# Patient Record
Sex: Male | Born: 1970 | Race: Black or African American | Hispanic: No | Marital: Married | Smoking: Never smoker
Health system: Southern US, Community
[De-identification: ages and names within clinical notes are randomized; demographics above are authoritative.]

## PROBLEM LIST (undated history)

## (undated) DIAGNOSIS — I10 Essential (primary) hypertension: Secondary | ICD-10-CM

## (undated) DIAGNOSIS — R7303 Prediabetes: Secondary | ICD-10-CM

## (undated) DIAGNOSIS — K760 Fatty (change of) liver, not elsewhere classified: Secondary | ICD-10-CM

## (undated) DIAGNOSIS — T7840XA Allergy, unspecified, initial encounter: Secondary | ICD-10-CM

## (undated) DIAGNOSIS — E039 Hypothyroidism, unspecified: Secondary | ICD-10-CM

## (undated) DIAGNOSIS — M1990 Unspecified osteoarthritis, unspecified site: Secondary | ICD-10-CM

## (undated) DIAGNOSIS — I517 Cardiomegaly: Secondary | ICD-10-CM

## (undated) HISTORY — DX: Unspecified osteoarthritis, unspecified site: M19.90

## (undated) HISTORY — DX: Cardiomegaly: I51.7

## (undated) HISTORY — DX: Hypothyroidism, unspecified: E03.9

## (undated) HISTORY — DX: Prediabetes: R73.03

## (undated) HISTORY — DX: Essential (primary) hypertension: I10

## (undated) HISTORY — DX: Fatty (change of) liver, not elsewhere classified: K76.0

## (undated) HISTORY — DX: Allergy, unspecified, initial encounter: T78.40XA

---

## 2015-01-22 ENCOUNTER — Other Ambulatory Visit: Payer: Self-pay | Admitting: Physician Assistant

## 2015-01-22 ENCOUNTER — Ambulatory Visit (INDEPENDENT_AMBULATORY_CARE_PROVIDER_SITE_OTHER): Payer: Self-pay | Admitting: Physician Assistant

## 2015-01-22 VITALS — BP 150/80 | HR 101 | Temp 99.0°F | Resp 20 | Ht 73.0 in | Wt 298.6 lb

## 2015-01-22 DIAGNOSIS — IMO0001 Reserved for inherently not codable concepts without codable children: Secondary | ICD-10-CM

## 2015-01-22 DIAGNOSIS — R030 Elevated blood-pressure reading, without diagnosis of hypertension: Secondary | ICD-10-CM

## 2015-01-22 LAB — POCT URINALYSIS DIP (MANUAL ENTRY)
BILIRUBIN UA: NEGATIVE
Bilirubin, UA: NEGATIVE
GLUCOSE UA: NEGATIVE
Leukocytes, UA: NEGATIVE
Nitrite, UA: NEGATIVE
Protein Ur, POC: NEGATIVE
SPEC GRAV UA: 1.02
Urobilinogen, UA: 0.2
pH, UA: 5.5

## 2015-01-22 MED ORDER — AMLODIPINE BESYLATE 2.5 MG PO TABS: 2.5000 mg | ORAL_TABLET | Freq: Every day | ORAL | Status: DC

## 2015-01-22 NOTE — Patient Instructions (Signed)
Blood Pressure Record Sheet Your blood pressure on this visit to the emergency department or clinic is elevated. This does not necessarily mean you have high blood pressure (hypertension), but it does mean that your blood pressure needs to be rechecked. Many times your blood pressure can increase due to illness, pain, anxiety, or other factors. We recommend that you get a series of blood pressure readings done over a period of 5 days. It is best to get a reading in the morning and one in the evening. You should make sure to sit and relax for 1-5 minutes before the reading is taken. Write the readings down and make a follow-up appointment with your health care provider to discuss the results. If there is not a free clinic or a drug store with a blood-pressure-taking machine near you, you can purchase blood-pressure-taking equipment from a drug store. Having one in the home allows you the convenience of taking your blood pressure while you are home and relaxed.  Your blood pressure in the emergency department or clinic on ________ was ____________________. BLOOD PRESSURE LOG  Date Time Blood pressure                                                                                 This information is not intended to replace advice given to you by your health care provider. Make sure you discuss any questions you have with your health care provider.   Document Released: 12/07/2002 Document Revised: 03/31/2014 Document Reviewed: 05/03/2013 Elsevier Interactive Patient Education [employer]2016 Elsevier Inc.

## 2015-01-22 NOTE — Progress Notes (Signed)
01/22/2015 at 7:41 PM  Shawn Becker / DOB: 24-Mar-1971 / MRN: [id number]  The patient  does not have a problem list on file.  SUBJECTIVE  Shawn Becker is a 44 y.o. male who complains of elevated BP. Reports he was at the dentist office today and his pressure was measured twice at 190/120.  He was advised to seek medical care.  Denies chest pain, SOB, leg and belly swelling, orthopnea, dizziness and HA.  Reports he has been taking work out supplements that include large amounts of caffeine and other poorly studied stimulants.  Denies steroid use.     He is concerned about his weight and would like to know the optimal weight for his height.    He  has a past medical history of Allergy.    Medications reviewed and updated by myself where necessary, and exist elsewhere in the encounter.   Shawn Becker is allergic to penicillins. He  reports that he has never smoked. He does not have any smokeless tobacco history on file. He reports that he does not drink alcohol or use illicit drugs. He  has no sexual activity history on file. The patient  has no past surgical history on file.  His family history includes Cancer in his maternal grandfather; Diabetes in his maternal grandmother and mother; Hyperlipidemia in his paternal grandmother; Hypertension in his maternal grandmother and mother; Mental retardation in his mother; Stroke in his mother.  Review of Systems  Respiratory: Negative for cough and shortness of breath.   Cardiovascular: Negative for chest pain.  Neurological: Negative for dizziness.    OBJECTIVE  His  height is 6\' 1"  (1.854 m) and weight is 298 lb 9.6 oz (135.444 kg). His oral temperature is 99 F (37.2 C). His blood pressure is 150/80 and his pulse is 101. His respiration is 20 and oxygen saturation is 98%.  The patient's body mass index is 39.4 kg/(m^2).  Physical Exam  Vitals reviewed. Constitutional: He is oriented to person, place, and time. He appears  well-developed. No distress.  Eyes: EOM are normal. Pupils are equal, round, and reactive to light. No scleral icterus.  Neck: Normal range of motion.  Cardiovascular: Normal rate, regular rhythm, normal heart sounds and intact distal pulses.  Exam reveals no gallop and no friction rub.   No murmur heard. Pulses:      Radial pulses are 2+ on the right side, and 2+ on the left side.       Dorsalis pedis pulses are 2+ on the right side, and 2+ on the left side.       Posterior tibial pulses are 2+ on the right side, and 2+ on the left side.  Pulse 90.   Respiratory: Effort normal and breath sounds normal.  GI: He exhibits no distension.  Musculoskeletal: Normal range of motion.  Neurological: He is alert and oriented to person, place, and time. No cranial nerve deficit.  Skin: Skin is warm and dry. No rash noted. He is not diaphoretic.  Psychiatric: He has a normal mood and affect.    Results for orders placed or performed in visit on 01/22/15 (from the past 24 hour(s))  POCT urinalysis dipstick     Status: Abnormal   Collection Time: 01/22/15  5:52 PM  Result Value Ref Range   Color, UA yellow yellow   Clarity, UA clear clear   Glucose, UA negative negative   Bilirubin, UA negative negative   Ketones, POC UA negative negative   Spec  Grav, UA 1.020    Blood, UA trace-intact (A) negative   pH, UA 5.5    Protein Ur, POC negative negative   Urobilinogen, UA 0.2    Nitrite, UA Negative Negative   Leukocytes, UA Negative Negative    ASSESSMENT & PLAN  Shawn Becker was seen today for hypertension.  Diagnoses and all orders for this visit:  Elevated BP: Patient with BP ranging in the stage one hypertensive range.  He is also obese.  I have advised that he purchase a BP cuff (large) and check and record daily.  Log provided via AVS and he is to return this to me in 2 to 3 weeks or review. Will start low dose amlodipine for now and will increase per ambulatory results.  -     CBC -      TSH -     COMPLETE METABOLIC PANEL WITH GFR -     POCT urinalysis dipstick -     amLODipine (NORVASC) 2.5 MG tablet; Take 1 tablet (2.5 mg total) by mouth daily.    The patient was advised to call or come back to clinic if he does not see an improvement in symptoms, or worsens with the above plan.   Deliah Boston, MHS, PA-C [facility] 01/22/2015 7:41 PM

## 2015-01-23 LAB — COMPLETE METABOLIC PANEL WITH GFR
ALBUMIN: 4.1 g/dL (ref 3.6–5.1)
ALT: 32 U/L (ref 9–46)
AST: 30 U/L (ref 10–40)
Alkaline Phosphatase: 70 U/L (ref 40–115)
BUN: 10 mg/dL (ref 7–25)
CO2: 26 mmol/L (ref 20–31)
Calcium: 9.5 mg/dL (ref 8.6–10.3)
Chloride: 101 mmol/L (ref 98–110)
Creat: 1.18 mg/dL (ref 0.60–1.35)
GFR, Est African American: 88 mL/min (ref >=60)
GFR, Est Non African American: 76 mL/min (ref >=60)
GLUCOSE: 87 mg/dL (ref 65–99)
POTASSIUM: 4.2 mmol/L (ref 3.5–5.3)
SODIUM: 138 mmol/L (ref 135–146)
Total Bilirubin: 0.6 mg/dL (ref 0.2–1.2)
Total Protein: 7.5 g/dL (ref 6.1–8.1)

## 2015-01-23 LAB — CBC
HEMATOCRIT: 42.7 % (ref 39.0–52.0)
HEMOGLOBIN: 14.1 g/dL (ref 13.0–17.0)
MCH: 25.2 pg — ABNORMAL LOW (ref 26.0–34.0)
MCHC: 33 g/dL (ref 30.0–36.0)
MCV: 76.3 fL — ABNORMAL LOW (ref 78.0–100.0)
MPV: 10.8 fL (ref 8.6–12.4)
Platelets: 267 10*3/uL (ref 150–400)
RBC: 5.6 MIL/uL (ref 4.22–5.81)
RDW: 15.7 % — AB (ref 11.5–15.5)
WBC: 4.2 10*3/uL (ref 4.0–10.5)

## 2015-01-23 LAB — TSH: TSH: 6.728 u[IU]/mL — ABNORMAL HIGH (ref 0.350–4.500)

## 2015-01-24 LAB — T4, FREE: Free T4: 0.84 ng/dL (ref 0.80–1.80)

## 2015-01-24 LAB — T3, FREE: T3 FREE: 3.2 pg/mL (ref 2.3–4.2)

## 2015-01-25 LAB — THYROID PEROXIDASE ANTIBODY: Thyroperoxidase Ab SerPl-aCnc: 763 IU/mL — ABNORMAL HIGH (ref <9)

## 2015-01-26 NOTE — Addendum Note (Signed)
Addended by: Carmelina DaneANDERSON, Talton Delpriore S on: 01/26/2015 08:49 AM   Modules accepted: Kipp BroodSmartSet

## 2015-01-26 NOTE — Progress Notes (Signed)
  Medical screening examination/treatment/procedure(s) were performed by non-physician practitioner and as supervising physician I was immediately available for consultation/collaboration.     

## 2015-01-29 ENCOUNTER — Telehealth: Payer: Self-pay

## 2015-01-29 NOTE — Telephone Encounter (Signed)
Pt is trying to get dental work done but blood pressure is still elevated and is wanting to discuss what could be changed   [phone]

## 2015-01-30 ENCOUNTER — Other Ambulatory Visit: Payer: Self-pay | Admitting: Physician Assistant

## 2015-01-30 DIAGNOSIS — E039 Hypothyroidism, unspecified: Secondary | ICD-10-CM

## 2015-01-30 NOTE — Telephone Encounter (Signed)
He needs to come in?  The patient was advised to call or come back to clinic if he does not see an improvement in symptoms, or worsens with the above plan.   Deliah BostonMichael Clark, MHS, PA-C [facility] 01/22/2015 7:41 PM

## 2015-01-31 NOTE — Telephone Encounter (Signed)
Last pressure in our office was largely normal.  I asked him to complete a BP log as this is the most accurate way to assess his pressure.  Please inquire if this has been done.  He was started on Norvasc by me and this can be increased, however I would be uncomfortable with doing this with no objective information. Deliah BostonMichael Lee Kalt, MS, PA-C   12:30 AM, 01/31/2015

## 2015-02-01 NOTE — Telephone Encounter (Signed)
Left message for pt to call back  °

## 2015-02-01 NOTE — Telephone Encounter (Signed)
Please advise that he increase his medication to 5 mg daily.  He is to continue monitoring and advise that he call back with new measures in 1 week. Will consider a second therapy pending those results.  Deliah BostonMichael Clark, MS, PA-C   2:56 PM, 02/01/2015

## 2015-02-01 NOTE — Telephone Encounter (Signed)
Spoke with pt, his log is below.   11/2 174/115 11/3 169/108 11/4 161/97  11/5 171/109 11/7 169/107 11/8 154/100

## 2015-02-05 NOTE — Telephone Encounter (Signed)
Spoke with pt, advised message from Deliah BostonMichael Clark. Pt understood.

## 2015-02-14 ENCOUNTER — Other Ambulatory Visit: Payer: Self-pay | Admitting: Physician Assistant

## 2015-02-14 DIAGNOSIS — I10 Essential (primary) hypertension: Secondary | ICD-10-CM

## 2015-02-14 MED ORDER — HYDROCHLOROTHIAZIDE 25 MG PO TABS: 12.5000 mg | ORAL_TABLET | Freq: Every day | ORAL | Status: DC

## 2015-02-20 ENCOUNTER — Other Ambulatory Visit (INDEPENDENT_AMBULATORY_CARE_PROVIDER_SITE_OTHER): Payer: Self-pay | Admitting: Family Medicine

## 2015-02-20 DIAGNOSIS — E039 Hypothyroidism, unspecified: Secondary | ICD-10-CM

## 2015-02-20 NOTE — Progress Notes (Signed)
Patient here today for Lab draw only 

## 2015-02-21 LAB — TSH: TSH: 5.506 u[IU]/mL — AB (ref 0.350–4.500)

## 2015-04-11 ENCOUNTER — Ambulatory Visit (INDEPENDENT_AMBULATORY_CARE_PROVIDER_SITE_OTHER): Payer: Self-pay | Admitting: Physician Assistant

## 2015-04-11 ENCOUNTER — Ambulatory Visit: Payer: Self-pay | Admitting: Physician Assistant

## 2015-04-11 ENCOUNTER — Encounter: Payer: Self-pay | Admitting: Physician Assistant

## 2015-04-11 ENCOUNTER — Telehealth: Payer: Self-pay

## 2015-04-11 VITALS — BP 140/80 | HR 108 | Temp 97.9°F | Resp 18 | Ht 72.5 in | Wt 294.0 lb

## 2015-04-11 DIAGNOSIS — R030 Elevated blood-pressure reading, without diagnosis of hypertension: Secondary | ICD-10-CM

## 2015-04-11 DIAGNOSIS — IMO0001 Reserved for inherently not codable concepts without codable children: Secondary | ICD-10-CM

## 2015-04-11 DIAGNOSIS — I10 Essential (primary) hypertension: Secondary | ICD-10-CM

## 2015-04-11 DIAGNOSIS — E063 Autoimmune thyroiditis: Secondary | ICD-10-CM

## 2015-04-11 LAB — POCT GLYCOSYLATED HEMOGLOBIN (HGB A1C): Hemoglobin A1C: 6

## 2015-04-11 MED ORDER — AMLODIPINE BESYLATE 10 MG PO TABS: 10.0000 mg | ORAL_TABLET | Freq: Every day | ORAL | Status: DC

## 2015-04-11 MED ORDER — HYDROCHLOROTHIAZIDE 25 MG PO TABS: 25.0000 mg | ORAL_TABLET | Freq: Every day | ORAL | Status: DC

## 2015-04-11 NOTE — Telephone Encounter (Signed)
Pt came by today to pay bill. Front desk realized pt was never called regarding elevated TSH. I spoke to pt and scheduled him to see you tomorrow at 3 to recheck thyroid and hypertension.

## 2015-04-11 NOTE — Telephone Encounter (Signed)
Pt. Called back wanting to know if was able to still come in and see michael clark for his 3:00 appt., I spoke with Casimiro Needle once we figured out what he needed for his recheck and Casimiro Needle asked could he just be rescheduled and I informed the Pt. That we would reschedule him and sent him to who schedules appt.

## 2015-04-11 NOTE — Telephone Encounter (Signed)
Ignore the scheduled appt for tomorrow. It was actually for 3pm today and pt cannot come today. Anyway we can fit him in during your appts next week? Your only opening was for a hospital follow up and they would not fill that with his recheck. I would really like to try and get pt an appt because it was our mistake on not calling him and getting him an appt sooner. I do not want to make him wait in the walk in clinic to see you.   Please let me know and I will call him.

## 2015-04-11 NOTE — Progress Notes (Signed)
04/11/2015 5:40 PM   DOB: 02/22/1971 / MRN: [id number]  SUBJECTIVE:  Shawn Becker is a 45 y.o. male presenting for HTN recheck.  I saw him roughly 3 months ago and he was complaining that he BP was elevated, however [facility] showed his BP was marginally elevated.  He was subsequently started on low dose amlodipine  Advised that he check this ambulatory and his BP was in fact running high (see phone messaging from 02/01/15).  Today his BP remains elevated despite Norvasc 5 mg.  He has not been taking HCTZ, reports that he has never   His Thyroid studies reveal Hashimotos.  However his TSH was marginally elevated at last check 1 month ago.    He is allergic to penicillins.   He  has a past medical history of Allergy.    He  reports that he has never smoked. He does not have any smokeless tobacco history on file. He reports that he does not drink alcohol or use illicit drugs. He  has no sexual activity history on file. The patient  has no past surgical history on file.  His family history includes Cancer in his maternal grandfather and paternal grandfather; Diabetes in his maternal grandmother and mother; Hyperlipidemia in his paternal grandmother; Hypertension in his maternal grandmother and mother; Mental illness in his mother; Stroke in his mother.  Review of Systems  Constitutional: Negative for fever and chills.  Eyes: Negative for blurred vision.  Respiratory: Negative for cough and shortness of breath.   Cardiovascular: Negative for chest pain.  Gastrointestinal: Negative for nausea and abdominal pain.  Genitourinary: Negative for dysuria, urgency and frequency.  Musculoskeletal: Negative for myalgias.  Skin: Negative for rash.  Neurological: Negative for dizziness, tingling and headaches.  Psychiatric/Behavioral: Negative for depression. The patient is not nervous/anxious.     Problem list and medications reviewed and updated by myself where necessary, and exist elsewhere in the  encounter.   OBJECTIVE:  BP 140/80 mmHg  Pulse 108  Temp(Src) 97.9 F (36.6 C)  Resp 18  Ht 6' 0.5" (1.842 m)  Wt 294 lb (133.358 kg)  BMI 39.30 kg/m2  Physical Exam  Constitutional: He is oriented to person, place, and time. He appears well-developed. He does not appear ill.  Eyes: Conjunctivae and EOM are normal. Pupils are equal, round, and reactive to light.  Neck: No thyromegaly present.  Cardiovascular: Regular rhythm and intact distal pulses.  Exam reveals no gallop and no friction rub.   No murmur heard. Pulmonary/Chest: Effort normal and breath sounds normal.  Abdominal: He exhibits no distension.  Musculoskeletal: Normal range of motion.  Neurological: He is alert and oriented to person, place, and time. No cranial nerve deficit. Coordination normal.  Skin: Skin is warm and dry. He is not diaphoretic.  Psychiatric: He has a normal mood and affect.  Nursing note and vitals reviewed.   Results for orders placed or performed in visit on 04/11/15 (from the past 48 hour(s))  POCT glycosylated hemoglobin (Hb A1C)     Status: None   Collection Time: 04/11/15  3:48 PM  Result Value Ref Range   Hemoglobin A1C 6.0     ASSESSMENT AND PLAN  Shawn Becker was seen today for follow-up, hypertension and thyroid problem.  Diagnoses and all orders for this visit:  Elevated BP: His BP at check in was 170/100.  This came down to 140/80 when I rechecked this. Will increase his medication to 10 mg Norvasc daily and advise that he monitor  his bp on an ambulatory basis for the next week and to call with these numbers.   -     amLODipine (NORVASC) 10 MG tablet; Take 1 tablet (10 mg total) by mouth daily.  Essential hypertension -     Discontinue: hydrochlorothiazide (HYDRODIURIL) 25 MG tablet; Take 1 tablet (25 mg total) by mouth daily. -     TSH -     POCT glycosylated hemoglobin (Hb A1C)  Hashimoto's thyroiditis: Will evaluate TSH further.  He will likely need some replacement.      -     TSH    The patient was advised to call or return to clinic if he does not see an improvement in symptoms or to seek the care of the closest emergency department if he worsens with the above plan.   Deliah Boston, MHS, PA-C [facility] 04/11/2015 5:40 PM

## 2015-04-12 LAB — TSH: TSH: 5.391 u[IU]/mL — ABNORMAL HIGH (ref 0.350–4.500)

## 2015-04-13 ENCOUNTER — Other Ambulatory Visit: Payer: Self-pay | Admitting: Physician Assistant

## 2015-04-13 DIAGNOSIS — E063 Autoimmune thyroiditis: Secondary | ICD-10-CM

## 2015-04-13 MED ORDER — LEVOTHYROXINE SODIUM 25 MCG PO TABS: 12.5000 ug | ORAL_TABLET | Freq: Every day | ORAL | Status: DC

## 2015-09-12 ENCOUNTER — Other Ambulatory Visit: Payer: Self-pay | Admitting: Physician Assistant

## 2016-02-21 ENCOUNTER — Other Ambulatory Visit: Payer: Self-pay | Admitting: Physician Assistant

## 2016-02-24 NOTE — Telephone Encounter (Signed)
03/2015 last ov 12/2014 last labs Needs ov

## 2016-02-29 ENCOUNTER — Other Ambulatory Visit: Payer: Self-pay | Admitting: Physician Assistant

## 2016-03-24 HISTORY — PX: WISDOM TOOTH EXTRACTION: SHX21

## 2016-04-15 ENCOUNTER — Ambulatory Visit (INDEPENDENT_AMBULATORY_CARE_PROVIDER_SITE_OTHER): Payer: Self-pay

## 2016-04-15 ENCOUNTER — Ambulatory Visit (INDEPENDENT_AMBULATORY_CARE_PROVIDER_SITE_OTHER): Payer: Self-pay | Admitting: Physician Assistant

## 2016-04-15 VITALS — BP 160/122 | HR 112 | Temp 99.6°F | Resp 14 | Ht 72.0 in | Wt 253.0 lb

## 2016-04-15 DIAGNOSIS — I517 Cardiomegaly: Secondary | ICD-10-CM

## 2016-04-15 DIAGNOSIS — R000 Tachycardia, unspecified: Secondary | ICD-10-CM

## 2016-04-15 DIAGNOSIS — I10 Essential (primary) hypertension: Secondary | ICD-10-CM | POA: Insufficient documentation

## 2016-04-15 DIAGNOSIS — Z6834 Body mass index (BMI) 34.0-34.9, adult: Secondary | ICD-10-CM

## 2016-04-15 DIAGNOSIS — E063 Autoimmune thyroiditis: Secondary | ICD-10-CM

## 2016-04-15 LAB — POCT GLYCOSYLATED HEMOGLOBIN (HGB A1C): Hemoglobin A1C: 5.8

## 2016-04-15 MED ORDER — LEVOTHYROXINE SODIUM 25 MCG PO TABS: 12.5000 ug | ORAL_TABLET | Freq: Every day | ORAL | 3 refills | Status: DC

## 2016-04-15 MED ORDER — METOPROLOL SUCCINATE ER 25 MG PO TB24: 25.0000 mg | ORAL_TABLET | Freq: Every day | ORAL | 3 refills | Status: DC

## 2016-04-15 MED ORDER — AMLODIPINE BESYLATE 10 MG PO TABS: 10.0000 mg | ORAL_TABLET | Freq: Every day | ORAL | 3 refills | Status: DC

## 2016-04-15 NOTE — Patient Instructions (Addendum)
It was great to see you again. I want to see you back in one month.     We are aiming for 140/90.  Please continue losing weight.    If you thyroid labs are too high or too low I want to order a lab recheck in about 1 month.  Please take the medication consistently.     IF you received an x-ray today, you will receive an [facility] from [facility]. Please contact [facility] at [phone] with questions or concerns regarding your invoice.   IF you received labwork today, you will receive an invoice from [facility]. Please contact [facility] at [phone] with questions or concerns regarding your invoice.   Our billing staff will not be able to assist you with questions regarding bills from these companies.  You will be contacted with the lab results as soon as they are available. The fastest way to get your results is to activate your My Chart account. Instructions are located on the last page of this paperwork. If you have not heard from us regarding the results in 2 weeks, please contact this office.

## 2016-04-15 NOTE — Progress Notes (Signed)
04/15/2016 5:25 PM   DOB: [birth year] / MRN: [id number]  SUBJECTIVE:  Shawn Becker is a 46 y.o. male presenting for BP medication refills. Reports his pressures have been running 160/120.  Denies any HA, chest pain, SOB or leg swelling with this.   Reports to me that his heart rate has always been elevated.  Tells me that he has been out of medication now for about 2 months, checked his BP because his girlfriend checked hers and she has now made him come in for elevated pressures.  No EKG or chest rads exist in the [facility].    He is allergic to penicillins.   He  has a past medical history of Allergy.    He  reports that he has never smoked. He has never used smokeless tobacco. He reports that he does not drink alcohol or use drugs. He  has no sexual activity history on file. The patient  has no past surgical history on file.  His family history includes Cancer in his maternal grandfather and paternal grandfather; Diabetes in his maternal grandmother and mother; Hyperlipidemia in his paternal grandmother; Hypertension in his maternal grandmother and mother; Mental illness in his mother; Stroke in his mother.  Review of Systems  Constitutional: Negative for chills, fever and malaise/fatigue.  Eyes: Negative for blurred vision.  Respiratory: Negative for cough.   Cardiovascular: Negative for chest pain.  Gastrointestinal: Negative for nausea.  Skin: Negative for rash.  Neurological: Negative for dizziness, weakness and headaches.  Psychiatric/Behavioral: Negative for depression.    The problem list and medications were reviewed and updated by myself where necessary and exist elsewhere in the encounter. f  OBJECTIVE:  BP (!) 160/122 (BP Location: Left Arm, Patient Position: Sitting, Cuff Size: Large)   Pulse (!) 112   Temp 99.6 F (37.6 C) (Oral)   Resp 14   Ht 6' (1.829 m)   Wt 253 lb (114.8 kg)   SpO2 99%   BMI 34.31 kg/m   Wt Readings from Last 3 Encounters:  04/15/16 253  lb (114.8 kg)  04/11/15 294 lb (133.4 kg)  01/22/15 298 lb 9.6 oz (135.4 kg)   Pulse Readings from Last 3 Encounters:  04/15/16 (!) 112  04/11/15 (!) 108  01/22/15 (!) 101     Physical Exam  Constitutional: He is oriented to person, place, and time. He appears well-developed and well-nourished. No distress.  Cardiovascular: Regular rhythm, normal heart sounds and intact distal pulses.  Exam reveals no gallop and no friction rub.   No murmur heard. Pulmonary/Chest: Effort normal and breath sounds normal. He has no wheezes. He has no rales.  Musculoskeletal: Normal range of motion. He exhibits no edema.  Neurological: He is alert and oriented to person, place, and time. He has normal reflexes. No cranial nerve deficit.  Skin: Skin is warm and dry. He is not diaphoretic.   Lab Results  Component Value Date   TSH 5.391 (H) 04/11/2015   Results for orders placed or performed in visit on 04/15/16 (from the past 72 hour(s))  POCT glycosylated hemoglobin (Hb A1C)     Status: None   Collection Time: 04/15/16  3:33 PM  Result Value Ref Range   Hemoglobin A1C 5.8     Dg Chest 2 View  Result Date: 04/15/2016 CLINICAL DATA:  Left ventricular hypertrophy. EXAM: CHEST  2 VIEW COMPARISON:  None. FINDINGS: The heart size and mediastinal contours are within normal limits. Both lungs are clear. The visualized skeletal structures are unremarkable.  IMPRESSION: No active cardiopulmonary disease. Electronically Signed   By: Lupita Raider, M.D.   On: 04/15/2016 15:18    ASSESSMENT AND PLAN:  [name] was seen today for medication refill.  Diagnoses and all orders for this visit:  Essential hypertension: Most likely driving the last problem.  He has had poor compliance with his medication over the last year.  I will see him back in one month and he will also see [facility] where he will likely receive an echo, counseling for diet modification and the importance of medication  compliance, and an improved medication regimen to prevent potential CHF in the future.  Of note his creatinine is elevated today as well. I will recheck this however if it remains elevated then this is also likely 2/2 to HTN.  Fortunately he is not a diabetic and has been successful in losing roughly 45 lbs in the last year.   -     Renal Function Panel -     CBC -     TSH -     amLODipine (NORVASC) 10 MG tablet; Take 1 tablet (10 mg total) by mouth daily. -     Care order/instruction:  Hashimoto's thyroiditis -     levothyroxine (LEVOTHROID) 25 MCG tablet; Take 0.5 tablets (12.5 mcg total) by mouth daily before breakfast.  Tachycardia -     EKG 12-Lead  BMI 34.0-34.9,adult -     POCT glycosylated hemoglobin (Hb A1C)  LVH (left ventricular hypertrophy) -     DG Chest 2 View; Future -     Ambulatory referral to Cardiology -     metoprolol succinate (TOPROL-XL) 25 MG 24 hr tablet; Take 1 tablet (25 mg total) by mouth daily.    The patient is advised to call or return to clinic if he does not see an improvement in symptoms, or to seek the care of the closest emergency department if he worsens with the above plan.   Deliah Boston, MHS, PA-C [facility] 04/15/2016 5:25 PM

## 2016-04-16 ENCOUNTER — Encounter: Payer: Self-pay | Admitting: *Deleted

## 2016-04-16 LAB — RENAL FUNCTION PANEL
ALBUMIN: 4.3 g/dL (ref 3.5–5.5)
BUN/Creatinine Ratio: 8 — ABNORMAL LOW (ref 9–20)
BUN: 11 mg/dL (ref 6–24)
CO2: 29 mmol/L (ref 18–29)
CREATININE: 1.39 mg/dL — AB (ref 0.76–1.27)
Calcium: 9.2 mg/dL (ref 8.7–10.2)
Chloride: 98 mmol/L (ref 96–106)
GFR calc non Af Amer: 62 mL/min/{1.73_m2} (ref >59)
GFR, EST AFRICAN AMERICAN: 71 mL/min/{1.73_m2} (ref >59)
Glucose: 90 mg/dL (ref 65–99)
PHOSPHORUS: 3.9 mg/dL (ref 2.5–4.5)
Potassium: 4.2 mmol/L (ref 3.5–5.2)
Sodium: 140 mmol/L (ref 134–144)

## 2016-04-16 LAB — CBC
HEMATOCRIT: 44.5 % (ref 37.5–51.0)
HEMOGLOBIN: 14.7 g/dL (ref 13.0–17.7)
MCH: 26.2 pg — AB (ref 26.6–33.0)
MCHC: 33 g/dL (ref 31.5–35.7)
MCV: 79 fL (ref 79–97)
Platelets: 239 10*3/uL (ref 150–379)
RBC: 5.61 x10E6/uL (ref 4.14–5.80)
RDW: 15.3 % (ref 12.3–15.4)
WBC: 3.6 10*3/uL (ref 3.4–10.8)

## 2016-04-16 LAB — TSH: TSH: 5.68 u[IU]/mL — AB (ref 0.450–4.500)

## 2016-05-15 ENCOUNTER — Encounter: Payer: Self-pay | Admitting: Internal Medicine

## 2016-06-21 ENCOUNTER — Telehealth: Payer: Self-pay | Admitting: Physician Assistant

## 2016-09-17 ENCOUNTER — Ambulatory Visit: Payer: Self-pay | Admitting: Physician Assistant

## 2017-04-14 ENCOUNTER — Encounter: Payer: Self-pay | Admitting: Adult Health

## 2017-04-14 ENCOUNTER — Ambulatory Visit (INDEPENDENT_AMBULATORY_CARE_PROVIDER_SITE_OTHER): Payer: No Typology Code available for payment source | Admitting: Adult Health

## 2017-04-14 VITALS — BP 140/90 | Temp 98.3°F | Ht 72.0 in | Wt 294.0 lb

## 2017-04-14 DIAGNOSIS — Z7689 Persons encountering health services in other specified circumstances: Secondary | ICD-10-CM | POA: Diagnosis not present

## 2017-04-14 DIAGNOSIS — I517 Cardiomegaly: Secondary | ICD-10-CM

## 2017-04-14 DIAGNOSIS — I10 Essential (primary) hypertension: Secondary | ICD-10-CM | POA: Diagnosis not present

## 2017-04-14 DIAGNOSIS — E063 Autoimmune thyroiditis: Secondary | ICD-10-CM

## 2017-04-14 MED ORDER — LOSARTAN POTASSIUM-HCTZ 100-12.5 MG PO TABS: 1.0000 | ORAL_TABLET | Freq: Every day | ORAL | 3 refills | Status: DC

## 2017-04-14 NOTE — Patient Instructions (Signed)
It was great meeting you today!   I am going to change your blood pressure medication and send this to the pharmacy   Someone from cardiology will call you to schedule your exam   Please follow up with me in 2-3 weeks for your physical

## 2017-04-14 NOTE — Progress Notes (Signed)
Patient presents to clinic today to establish care. He is a pleasant 47 year old male who  has a past medical history of Allergy, Hypertension, and Hypothyroidism.   Acute Concerns: Establish Care   Chronic Issues: Essential Hypertension - Takes Metoprolol 25 mg and Hyzaar 50-12.5 mg. He checks his BP at home on occasion and reports readings in the 150/90 consistently   LVH - reports being seen by Cardiology in the past [facility]( [facility]). He would like to see a cardiologist in the [facility]   Hashimoto Thyroiditis  - takes Synthroid 12.5 mg daily. Denies any issues with his thyroid.    Health Maintenance: Dental -- Routine Care  Vision -- Routine Care  Immunizations -- Has had tdap. Does not want flu shot  Colonoscopy -- Never had a colonoscopy  Diet: Does not follow a specific diet  Exercise: Does not exercise on a routine basis.   Past Medical History:  Diagnosis Date  . Allergy   . Hypertension   . Hypothyroidism     History reviewed. No pertinent surgical history.  Current Outpatient Medications on File Prior to Visit  Medication Sig Dispense Refill  . levothyroxine (LEVOTHROID) 25 MCG tablet Take 0.5 tablets (12.5 mcg total) by mouth daily before breakfast. 90 tablet 3  . losartan-hydrochlorothiazide (HYZAAR) 50-12.5 MG tablet Take 1 tablet by mouth daily.    . metoprolol succinate (TOPROL-XL) 25 MG 24 hr tablet Take 1 tablet (25 mg total) by mouth daily. 30 tablet 3   No current facility-administered medications on file prior to visit.     Allergies  Allergen Reactions  . Penicillins     Family History  Problem Relation Age of Onset  . Diabetes Mother   . Stroke Mother   . Hypertension Mother   . Mental illness Mother   . Diabetes Maternal Grandmother   . Hypertension Maternal Grandmother   . Cancer Maternal Grandfather        colon cancer  . Hyperlipidemia Paternal Grandmother   . Cancer Paternal Grandfather        prostate cancer     Social History   Socioeconomic History  . Marital status: Significant Other    Spouse name: Not on file  . Number of children: Not on file  . Years of education: Not on file  . Highest education level: Not on file  Social Needs  . Financial resource strain: Not on file  . Food insecurity - worry: Not on file  . Food insecurity - inability: Not on file  . Transportation needs - medical: Not on file  . Transportation needs - non-medical: Not on file  Occupational History  . Not on file  Tobacco Use  . Smoking status: Never Smoker  . Smokeless tobacco: Never Used  Substance and Sexual Activity  . Alcohol use: No    Alcohol/week: 0.0 oz  . Drug use: No  . Sexual activity: Not on file  Other Topics Concern  . Not on file  Social History Narrative  . Not on file    Review of Systems  Constitutional: Negative.   Eyes: Negative.   Respiratory: Negative.   Cardiovascular: Negative.   Gastrointestinal: Negative.   Genitourinary: Negative.   Musculoskeletal: Negative.   Skin: Negative.   Neurological: Negative.   Endo/Heme/Allergies: Negative.   Psychiatric/Behavioral: Negative.   All other systems reviewed and are negative.   BP 140/90 (BP Location: Left Arm)   Temp 98.3 F (36.8 C) (Oral)  Ht 6' (1.829 m)   Wt 294 lb (133.4 kg)   BMI 39.87 kg/m   Physical Exam  Constitutional: He is oriented to person, place, and time and well-developed, well-nourished, and in no distress. No distress.  HENT:  Nose: Nose normal.  Eyes: Conjunctivae and EOM are normal. Pupils are equal, round, and reactive to light. Right eye exhibits no discharge. Left eye exhibits no discharge. No scleral icterus.  Neck: Normal range of motion. Neck supple. No JVD present. No tracheal deviation present. No thyromegaly present.  Cardiovascular: Normal rate, regular rhythm, normal heart sounds and intact distal pulses. Exam reveals no gallop and no friction rub.  No murmur  heard. Pulmonary/Chest: Effort normal and breath sounds normal. No stridor. No respiratory distress. He has no wheezes. He has no rales. He exhibits no tenderness.  Musculoskeletal: Normal range of motion. He exhibits no edema, tenderness or deformity.  Lymphadenopathy:    He has no cervical adenopathy.  Neurological: He is alert and oriented to person, place, and time. He displays normal reflexes. No cranial nerve deficit. He exhibits normal muscle tone. Gait normal. Coordination normal. GCS score is 15.  Skin: Skin is warm and dry. No rash noted. He is not diaphoretic. No erythema. No pallor.  Psychiatric: Mood, memory, affect and judgment normal.  Nursing note and vitals reviewed.   Assessment/Plan:  1. Encounter to establish care - Follow up in two weeks or sooner if needed - Encouraged heart healthy diet and frequent exercise    2. Left ventricular hypertrophy - losartan-hydrochlorothiazide (HYZAAR) 100-12.5 MG tablet; Take 1 tablet by mouth daily.  Dispense: 90 tablet; Refill: 3 - Ambulatory referral to Cardiology  3. Hashimoto's thyroiditis - Continue with current dose of synthroid   4. Essential hypertension - Will reevaluate in 2 weeks when he returns for his CPE  - losartan-hydrochlorothiazide (HYZAAR) 100-12.5 MG tablet; Take 1 tablet by mouth daily.  Dispense: 90 tablet; Refill: 3  Shirline Frees, NP

## 2017-05-05 ENCOUNTER — Ambulatory Visit (INDEPENDENT_AMBULATORY_CARE_PROVIDER_SITE_OTHER): Payer: No Typology Code available for payment source | Admitting: Adult Health

## 2017-05-05 ENCOUNTER — Encounter: Payer: Self-pay | Admitting: Adult Health

## 2017-05-05 VITALS — BP 140/100 | HR 92 | Temp 99.0°F | Ht 72.0 in | Wt 287.0 lb

## 2017-05-05 DIAGNOSIS — Z114 Encounter for screening for human immunodeficiency virus [HIV]: Secondary | ICD-10-CM

## 2017-05-05 DIAGNOSIS — Z0000 Encounter for general adult medical examination without abnormal findings: Secondary | ICD-10-CM

## 2017-05-05 DIAGNOSIS — G5603 Carpal tunnel syndrome, bilateral upper limbs: Secondary | ICD-10-CM | POA: Diagnosis not present

## 2017-05-05 DIAGNOSIS — I10 Essential (primary) hypertension: Secondary | ICD-10-CM | POA: Diagnosis not present

## 2017-05-05 DIAGNOSIS — E063 Autoimmune thyroiditis: Secondary | ICD-10-CM | POA: Diagnosis not present

## 2017-05-05 LAB — LIPID PANEL
CHOLESTEROL: 145 mg/dL (ref 0–200)
HDL: 31.6 mg/dL — ABNORMAL LOW (ref >39.00)
LDL CALC: 82 mg/dL (ref 0–99)
NonHDL: 113.67
TRIGLYCERIDES: 156 mg/dL — AB (ref 0.0–149.0)
Total CHOL/HDL Ratio: 5
VLDL: 31.2 mg/dL (ref 0.0–40.0)

## 2017-05-05 LAB — CBC WITH DIFFERENTIAL/PLATELET
BASOS PCT: 0.8 % (ref 0.0–3.0)
Basophils Absolute: 0 10*3/uL (ref 0.0–0.1)
EOS PCT: 4.6 % (ref 0.0–5.0)
Eosinophils Absolute: 0.2 10*3/uL (ref 0.0–0.7)
HEMATOCRIT: 43.8 % (ref 39.0–52.0)
HEMOGLOBIN: 14.4 g/dL (ref 13.0–17.0)
LYMPHS PCT: 40.2 % (ref 12.0–46.0)
Lymphs Abs: 1.8 10*3/uL (ref 0.7–4.0)
MCHC: 32.8 g/dL (ref 30.0–36.0)
MCV: 80.4 fl (ref 78.0–100.0)
Monocytes Absolute: 0.6 10*3/uL (ref 0.1–1.0)
Monocytes Relative: 12.4 % — ABNORMAL HIGH (ref 3.0–12.0)
Neutro Abs: 1.9 10*3/uL (ref 1.4–7.7)
Neutrophils Relative %: 42 % — ABNORMAL LOW (ref 43.0–77.0)
Platelets: 279 10*3/uL (ref 150.0–400.0)
RBC: 5.45 Mil/uL (ref 4.22–5.81)
RDW: 14.8 % (ref 11.5–15.5)
WBC: 4.4 10*3/uL (ref 4.0–10.5)

## 2017-05-05 LAB — COMPREHENSIVE METABOLIC PANEL
ALT: 24 U/L (ref 0–53)
AST: 22 U/L (ref 0–37)
Albumin: 4.3 g/dL (ref 3.5–5.2)
Alkaline Phosphatase: 69 U/L (ref 39–117)
BUN: 14 mg/dL (ref 6–23)
CALCIUM: 9.1 mg/dL (ref 8.4–10.5)
CHLORIDE: 103 meq/L (ref 96–112)
CO2: 31 mEq/L (ref 19–32)
Creatinine, Ser: 1.19 mg/dL (ref 0.40–1.50)
GFR: 85.35 mL/min (ref >60.00)
Glucose, Bld: 94 mg/dL (ref 70–99)
POTASSIUM: 4.3 meq/L (ref 3.5–5.1)
Sodium: 141 mEq/L (ref 135–145)
Total Bilirubin: 0.6 mg/dL (ref 0.2–1.2)
Total Protein: 7.5 g/dL (ref 6.0–8.3)

## 2017-05-05 LAB — TSH: TSH: 5.15 u[IU]/mL — ABNORMAL HIGH (ref 0.35–4.50)

## 2017-05-05 LAB — T3, FREE: T3, Free: 3.2 pg/mL (ref 2.3–4.2)

## 2017-05-05 LAB — T4, FREE: Free T4: 0.65 ng/dL (ref 0.60–1.60)

## 2017-05-05 NOTE — Progress Notes (Signed)
Subjective:    Patient ID: Shawn Becker, male    DOB: 1970/09/10, 47 y.o.   MRN: [id number]  HPI  Patient presents for yearly preventative medicine examination. He is a pleasant 47 year old male who  has a past medical history of Allergy, Hypertension, Hypothyroidism, and Left ventricular hypertrophy.  Essential Hypertension - Takes Metoprolol 25 mg and Hyzaar 100-12.5 mg. He checks his BP at home on occasion and has reported readings in the 120/90's. He did not take his medication this morning due to fasting.  BP Readings from Last 3 Encounters:  05/05/17 (!) 140/100  04/14/17 140/90  04/15/16 (!) 160/122    LVH - reports being seen by Cardiology in the past [facility]). - has an upcoming appointment with [facility].   Hashimoto Thyroiditis  - takes Synthroid 12.5 mg daily. Denies any issues with his thyroid. - stable   All immunizations and health maintenance protocols were reviewed with the patient and needed orders were placed.  Appropriate screening laboratory values were ordered for the patient including screening of hyperlipidemia, renal function and hepatic function. If indicated by BPH, a PSA was ordered.  Medication reconciliation,  past medical history, social history, problem list and allergies were reviewed in detail with the patient  Goals were established with regard to weight loss, exercise, and  diet in compliance with medications.  He has been working on diet and exercise and has been able to lose 7 pounds. He is going to join [employer] today   [employer] from Last 3 Encounters:  05/05/17 287 lb (130.2 kg)  04/14/17 294 lb (133.4 kg)  04/15/16 253 lb (114.8 kg)    He does participate in routine dental and vision exams.  His only acute complaint is that of a sensation of weakness in his bilateral hands. This happens mostly at night. Will feel a sensation of "numbness" when his hands hang on the sides of the bed. Sensation  resolves with ROM exercises. He does a lot of repetitive motion at work  Review of Systems  HENT: Negative.   Eyes: Negative.   Respiratory: Negative.   Cardiovascular: Negative.   Gastrointestinal: Negative.   Endocrine: Negative.   Genitourinary: Negative.   Musculoskeletal: Negative.   Skin: Negative.   Allergic/Immunologic: Negative.   Neurological: Positive for weakness (bilateral hands ).  Hematological: Negative.   Psychiatric/Behavioral: Negative.   All other systems reviewed and are negative.  Past Medical History:  Diagnosis Date  . Allergy   . Hypertension   . Hypothyroidism   . Left ventricular hypertrophy     Social History   Socioeconomic History  . Marital status: Married    Spouse name: Not on file  . Number of children: Not on file  . Years of education: Not on file  . Highest education level: Not on file  Social Needs  . Financial resource strain: Not on file  . Food insecurity - worry: Not on file  . Food insecurity - inability: Not on file  . Transportation needs - medical: Not on file  . Transportation needs - non-medical: Not on file  Occupational History  . Not on file  Tobacco Use  . Smoking status: Never Smoker  . Smokeless tobacco: Never Used  Substance and Sexual Activity  . Alcohol use: No    Alcohol/week: 0.0 oz  . Drug use: No  . Sexual activity: Not on file  Other Topics Concern  . Not on file  Social  History Narrative   Renato Gails    Married    4 children          He likes to ride his motorcycle.        History reviewed. No pertinent surgical history.  Family History  Problem Relation Age of Onset  . Diabetes Mother   . Hypertension Mother   . Mental illness Mother   . Diabetes Maternal Grandmother   . Hypertension Maternal Grandmother   . Cancer Maternal Grandfather        colon cancer  . Hyperlipidemia Paternal Grandmother   . Cancer Paternal Grandfather        prostate cancer    Allergies  Allergen Reactions   . Penicillins     Current Outpatient Medications on File Prior to Visit  Medication Sig Dispense Refill  . LEVOTHYROXINE SODIUM PO Take by mouth. UNSURE OF STRENGTH.    Marland Kitchen losartan-hydrochlorothiazide (HYZAAR) 100-12.5 MG tablet Take 1 tablet by mouth daily. 90 tablet 3  . metoprolol succinate (TOPROL-XL) 25 MG 24 hr tablet Take 1 tablet (25 mg total) by mouth daily. 30 tablet 3   No current facility-administered medications on file prior to visit.     BP (!) 140/100 Comment: PT HAS NOT HAD MEDICATION TODAY  Temp 99 F (37.2 C) (Oral)   Ht 6' (1.829 m)   Wt 287 lb (130.2 kg)   BMI 38.92 kg/m       Objective:   Physical Exam  Constitutional: He is oriented to person, place, and time. He appears well-developed and well-nourished. No distress.  Obese   HENT:  Head: Normocephalic and atraumatic.  Right Ear: External ear normal.  Left Ear: External ear normal.  Nose: Nose normal.  Mouth/Throat: Oropharynx is clear and moist. No oropharyngeal exudate.  Eyes: Conjunctivae and EOM are normal. Pupils are equal, round, and reactive to light. Right eye exhibits no discharge. Left eye exhibits no discharge. No scleral icterus.  Neck: Normal range of motion. Neck supple. No JVD present. No tracheal deviation present. No thyromegaly present.  Cardiovascular: Normal rate, regular rhythm, normal heart sounds and intact distal pulses. Exam reveals no gallop and no friction rub.  No murmur heard. Pulmonary/Chest: Effort normal and breath sounds normal. No stridor. No respiratory distress. He has no wheezes. He has no rales. He exhibits no tenderness.  Abdominal: Soft. Bowel sounds are normal. He exhibits no distension and no mass. There is no tenderness. There is no rebound and no guarding.  Musculoskeletal: Normal range of motion. He exhibits no edema, tenderness or deformity.  Lymphadenopathy:    He has no cervical adenopathy.  Neurological: He is alert and oriented to person, place, and  time. He has normal reflexes. He displays normal reflexes. No cranial nerve deficit. He exhibits normal muscle tone. Coordination normal.  Negative Tinels and phalen test   Skin: Skin is warm and dry. No rash noted. He is not diaphoretic. No erythema. No pallor.  Psychiatric: He has a normal mood and affect. His behavior is normal. Judgment and thought content normal.  Nursing note and vitals reviewed.     Assessment & Plan:  1. Routine general medical examination at a health care facility - Continue to work on diet and exercise  - Follow up in one year or sooner if needed - CBC with Differential/Platelet - CMP - Lipid panel - TSH - T3, Free - T4, Free  2. Essential hypertension - He is making lifestyle modifications. I am going to hold off  on any additional medication at this time  - CBC with Differential/Platelet - CMP - Lipid panel - TSH - T3, Free - T4, Free  3. Hashimoto's thyroiditis - Consider increase in synthroid  - CBC with Differential/Platelet - CMP - Lipid panel - TSH - T3, Free - T4, Free  4. Encounter for screening for HIV  - HIV antibody  5. Bilateral carpal tunnel syndrome - possibly beginning of carpal tunnel. Advised splinting at night.    Shirline Freesory Jerl Munyan, NP

## 2017-05-05 NOTE — Progress Notes (Signed)
Subjective:    Patient ID: Shawn Becker, male    DOB: July 21, 1970, 47 y.o.   MRN: [id number]  HPI Patient presents for yearly preventative medicine examination. Up to date on dental and eye exams.    Has concerns about some numbness in both of his hands during the night.  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 Also would like recommendations about allergy medications.  All immunizations and health maintenance protocols were reviewed with the patient and needed orders were placed.  Appropriate screening laboratory values were ordered for the patient including screening of hyperlipidemia, renal function and hepatic function. Working on his diet and exercise.  Joining gym. He's cut out white starchy foods, limiting fried foods.   Medication reconciliation,  past medical history, social history, problem list and allergies were reviewed in detail with the patient.  Will need refills on Hyzaar.  Goals were established with regard to weight loss, exercise, and  diet in compliance with medications.  Continued encouragement of dietary modifications and exercise.   End of life planning was discussed. Given information about living will.  Wife is next of kin and would make decisions.   Review of Systems  Constitutional: Negative.   HENT: Positive for congestion. Negative for ear pain, facial swelling, hearing loss, postnasal drip, rhinorrhea, sinus pressure, sinus pain, sneezing, sore throat and tinnitus.   Respiratory: Negative.   Cardiovascular: Negative.   Gastrointestinal: Negative.   Endocrine: Negative.   Genitourinary: Negative.   Musculoskeletal: Negative.   Neurological: Positive for numbness. Negative for dizziness, tremors, speech difficulty, weakness and headaches.       Numbness in bilateral hands that he notices at night or when he wakes up in the morning.   Psychiatric/Behavioral: Negative.       Past Medical History:    Diagnosis Date  . Allergy   . Hypertension   . Hypothyroidism   . Left ventricular hypertrophy     Social History   Socioeconomic History  . Marital status: Married    Spouse name: Not on file  . Number of children: Not on file  . Years of education: Not on file  . Highest education level: Not on file  Social Needs  . Financial resource strain: Not on file  . Food insecurity - worry: Not on file  . Food insecurity - inability: Not on file  . Transportation needs - medical: Not on file  . Transportation needs - non-medical: Not on file  Occupational History  . Not on file  Tobacco Use  . Smoking status: Never Smoker  . Smokeless tobacco: Never Used  Substance and Sexual Activity  . Alcohol use: No    Alcohol/week: 0.0 oz  . Drug use: No  . Sexual activity: Not on file  Other Topics Concern  . Not on file  Social History Narrative   Renato Gails    Married    4 children          He likes to ride his motorcycle.        History reviewed. No pertinent surgical history.  Family History  Problem Relation Age of Onset  . Diabetes Mother   . Hypertension Mother   . Mental illness Mother   . Diabetes Maternal Grandmother   . Hypertension Maternal Grandmother   . Cancer Maternal Grandfather        colon cancer  . Hyperlipidemia Paternal Grandmother   . Cancer Paternal Grandfather  prostate cancer    Allergies  Allergen Reactions  . Penicillins     Current Outpatient Medications on File Prior to Visit  Medication Sig Dispense Refill  . LEVOTHYROXINE SODIUM PO Take by mouth. UNSURE OF STRENGTH.    Marland Kitchen. losartan-hydrochlorothiazide (HYZAAR) 100-12.5 MG tablet Take 1 tablet by mouth daily. 90 tablet 3  . metoprolol succinate (TOPROL-XL) 25 MG 24 hr tablet Take 1 tablet (25 mg total) by mouth daily. 30 tablet 3   No current facility-administered medications on file prior to visit.     BP (!) 140/100 Comment: PT HAS NOT HAD MEDICATION TODAY  Temp 99 F (37.2  C) (Oral)   Ht 6' (1.829 m)   Wt 287 lb (130.2 kg)   BMI 38.92 kg/m    Resting heartrate 92  Objective:   Physical Exam  Constitutional: He is oriented to person, place, and time. He appears well-developed and well-nourished. No distress.  HENT:  Head: Normocephalic and atraumatic.  Nose: Nose normal.  Mouth/Throat: Oropharynx is clear and moist.  Eyes: EOM are normal. Pupils are equal, round, and reactive to light.  Neck: Normal range of motion. Neck supple. No JVD present. Carotid bruit is not present. No thyromegaly present.  Cardiovascular: Normal rate, regular rhythm, normal heart sounds, intact distal pulses and normal pulses. Exam reveals no gallop and no friction rub.  No murmur heard. Pulmonary/Chest: Effort normal and breath sounds normal.  Abdominal: Soft. Bowel sounds are normal. There is no hepatosplenomegaly. There is no tenderness.  Musculoskeletal: Normal range of motion.  Upper and lower extremity strength 5/5  Lymphadenopathy:       Head (right side): No submental, no submandibular, no tonsillar, no preauricular, no posterior auricular and no occipital adenopathy present.       Head (left side): No submental, no submandibular, no tonsillar, no preauricular, no posterior auricular and no occipital adenopathy present.    He has no cervical adenopathy.  Neurological: He is alert and oriented to person, place, and time. He has normal strength.  Negative Phalen's test bilateral  Skin: Skin is warm and dry. He is not diaphoretic.  Psychiatric: He has a normal mood and affect. His behavior is normal. Judgment and thought content normal.  Nursing note and vitals reviewed.     Assessment & Plan:

## 2017-05-06 LAB — HIV ANTIBODY (ROUTINE TESTING W REFLEX): HIV 1&2 Ab, 4th Generation: NONREACTIVE

## 2017-05-25 ENCOUNTER — Ambulatory Visit (INDEPENDENT_AMBULATORY_CARE_PROVIDER_SITE_OTHER): Payer: No Typology Code available for payment source | Admitting: Internal Medicine

## 2017-05-25 ENCOUNTER — Encounter: Payer: Self-pay | Admitting: Internal Medicine

## 2017-05-25 VITALS — BP 134/86 | HR 84 | Ht 71.5 in | Wt 291.4 lb

## 2017-05-25 DIAGNOSIS — E063 Autoimmune thyroiditis: Secondary | ICD-10-CM | POA: Diagnosis not present

## 2017-05-25 DIAGNOSIS — I10 Essential (primary) hypertension: Secondary | ICD-10-CM

## 2017-05-25 DIAGNOSIS — R9431 Abnormal electrocardiogram [ECG] [EKG]: Secondary | ICD-10-CM

## 2017-05-25 DIAGNOSIS — I517 Cardiomegaly: Secondary | ICD-10-CM

## 2017-05-25 MED ORDER — LEVOTHYROXINE SODIUM 25 MCG PO TABS: 25.0000 ug | ORAL_TABLET | Freq: Every day | ORAL | 1 refills | Status: DC

## 2017-05-25 NOTE — Patient Instructions (Addendum)
Your physician recommends that you continue on your current medications as directed. Please refer to the Current Medication list given to you today.  Your physician has recommended that you have a sleep study. This test records several body functions during sleep, including: brain activity, eye movement, oxygen and carbon dioxide blood levels, heart rate and rhythm, breathing rate and rhythm, the flow of air through your mouth and nose, snoring, body muscle movements, and chest and belly movement.  Your physician wants you to follow-up in: end of July with Dr. Tenny Crawoss.  You will receive a reminder letter in the mail two months in advance. If you don't receive a letter, please call our office to schedule the follow-up appointment.

## 2017-05-25 NOTE — Progress Notes (Signed)
Cardiology Office Note   Date:  05/25/2017   ID:  Shawn Becker, DOB 1970/04/03, MRN [id number]  PCP:  Shirline FreesNafziger, Cory, NP  Cardiologist:   Dietrich PatesPaula Tniyah Nakagawa, MD   Pt referred by Dr Evelene CroonNafziger for LVH     History of Present Illness: Shawn DungChristopher R Fantini is a 47 y.o. male with a history of HTN, LVH and hypothyroidism   He was seen in past for LVH by Jeanella CaraJ Ganji  Wants to switch   Patient has known about HTN for about 2 year  Very high at that tiime  COuld have beenhighfor a longer time Pt says he checks bp occasionally at home   Runs a little high but better than was before 130s/80s   He denies CP   Does get SOB if for example he plays with dogs  Not with walking    No PND  No dizziness  No palpitations  Snores    Admits to eating a lot of carbs  Wt was down in 2017  Back up    Current Meds  Medication Sig  . levothyroxine (SYNTHROID, LEVOTHROID) 25 MCG tablet Take 25 mcg by mouth daily before breakfast.  . losartan-hydrochlorothiazide (HYZAAR) 100-12.5 MG tablet Take 1 tablet by mouth daily.  . metoprolol succinate (TOPROL-XL) 25 MG 24 hr tablet Take 1 tablet (25 mg total) by mouth daily.  . [DISCONTINUED] LEVOTHYROXINE SODIUM PO Take by mouth. UNSURE OF STRENGTH.     Allergies:   Penicillins   Past Medical History:  Diagnosis Date  . Allergy   . Hypertension   . Hypothyroidism   . Left ventricular hypertrophy     History reviewed. No pertinent surgical history.   Social History:  The patient  reports that  has never smoked. he has never used smokeless tobacco. He reports that he does not drink alcohol or use drugs.   Family History:  The patient's family history includes Cancer in his maternal grandfather and paternal grandfather; Diabetes in his maternal grandmother and mother; Hyperlipidemia in his paternal grandmother; Hypertension in his maternal grandmother and mother; Mental illness in his mother.    ROS:  Please see the history of present illness. All  other systems are reviewed and  Negative to the above problem except as noted.    PHYSICAL EXAM: VS:  BP 134/86   Pulse 84   Ht 5' 11.5" (1.816 m)   Wt 291 lb 6.4 oz (132.2 kg)   BMI 40.08 kg/m   GEN: Well nourished, well developed, in no acute distress  HEENT: normal  Neck: no JVD, carotid bruits, or masses Cardiac: RRR; no murmurs, rubs, or gallops,no edema  Respiratory:  clear to auscultation bilaterally, normal work of breathing GI: soft, nontender, nondistended, + BS  No hepatomegaly  MS: no deformity Moving all extremities   Skin: warm and dry, no rash Neuro:  Strength and sensation are intact Psych: euthymic mood, full affect   EKG:  EKG is ordered today.  SR 84   Minimal criteria for LVH     Lipid Panel    Component Value Date/Time   CHOL 145 05/05/2017 1157   TRIG 156.0 (H) 05/05/2017 1157   HDL 31.60 (L) 05/05/2017 1157   CHOLHDL 5 05/05/2017 1157   VLDL 31.2 05/05/2017 1157   LDLCALC 82 05/05/2017 1157      Wt Readings from Last 3 Encounters:  05/25/17 291 lb 6.4 oz (132.2 kg)  05/05/17 287 lb (130.2 kg)  04/14/17 294 lb (133.4 kg)  ASSESSMENT AND PLAN:  1  HTN  BP is not optimal  Recently increased   Much better than before  Will review outside reccords  Discussed wt loss  Will look into CPAP  2  LVH  Pt had work up by Jeanella Cara  Will get records including echo   3  DOE  Review echo  I am not convinced of angina   May be related to BP and size and LVH   No further testing until review recoreds  4  ? Sleep apnea  Set up for sleep eval  Hx is suspicous  5  Morbid obesity  Discussed diet and exercise  6  Lipids  LDL good at 82  HDL low at 32 Will improve with wt loss and exercise    7  Hashimoto's thyroiditis  On small dose of synthroid.  TSH minimally elevated but free T3, free t4 normal  Follow   I would set f/u in July to make sure problems addressed, BP OK   Current medicines are reviewed at length with the patient today.  The patient  does not have concerns regarding medicines.  Signed, Dietrich Pates, MD  05/25/2017 9:04 AM    [facility] [address] [place], [place], [place]  [address] Phone: [phone]; Fax: [phone]

## 2017-06-26 ENCOUNTER — Telehealth: Payer: Self-pay | Admitting: *Deleted

## 2017-06-26 ENCOUNTER — Other Ambulatory Visit: Payer: Self-pay | Admitting: *Deleted

## 2017-06-26 DIAGNOSIS — I517 Cardiomegaly: Secondary | ICD-10-CM

## 2017-06-26 NOTE — Progress Notes (Signed)
Notes recorded by Pricilla Riffleoss, Paula V, MD on 06/24/2017 at 3:52 PM EDT Reviewed report from Dr Jacinto HalimGanji Echo from March 2018  Pumping function was not normal Moderately depressed Pt should have another echo to confirm still same or has it improved.    Spoke with patient. Echo scheduled for 07/02/17. Pt has not heard about sleep study.  Will send message to sleep assistant.

## 2017-06-26 NOTE — Telephone Encounter (Signed)
-----   Message from Lendon KaMichalene Wilson, RN sent at 06/26/2017  1:55 PM EDT ----- Regarding: sleep study Hey there, I spoke with patient today about another test, but he asked about having sleep study.  Has not heard yet.  Is there anything I need to do? Thanks, [employer] CorporationMichalene

## 2017-06-26 NOTE — Telephone Encounter (Signed)
Order  is in will send to pre cert.

## 2017-06-29 ENCOUNTER — Telehealth: Payer: Self-pay | Admitting: *Deleted

## 2017-06-29 NOTE — Telephone Encounter (Signed)
Patient is scheduled for lab study on Monday July 20 2017. Patient [facility]. Left detailed message on voicemail with date and time of titration and informed patient to call back to confirm or reschedule.

## 2017-06-29 NOTE — Telephone Encounter (Signed)
Message sent to Coralee Northina, ok to schedule sleep study. Per patient's insurance ([facility] [employer] plan) no PA needed. Reference # [id number].

## 2017-06-29 NOTE — Telephone Encounter (Signed)
  Gaynelle CageWaddell, Wanda M, CMA  Reesa ChewJones, Quinnley Colasurdo G, CMA        Ok to schedule no PA needed.     ----- Message -----  From: Reesa ChewJones, Zanyia Silbaugh G, CMA  Sent: 06/26/2017  5:10 PM  To: [facility]  Subject: pre cert                       ----- Message -----  From: Lendon KaWilson, Michalene, RN  Sent: 06/26/2017  1:55 PM  To: Reesa Cheworothea G Laryn Venning, CMA, Lendon KaMichalene Wilson, RN  Subject: sleep study                    Hey there,  I spoke with patient today about another test, but he asked about having sleep study. Has not heard yet. Is there anything I need to do?  Thanks,  [employer] Pacific CorporationMichalene

## 2017-07-02 ENCOUNTER — Other Ambulatory Visit: Payer: Self-pay

## 2017-07-02 ENCOUNTER — Encounter (INDEPENDENT_AMBULATORY_CARE_PROVIDER_SITE_OTHER): Payer: Self-pay

## 2017-07-02 ENCOUNTER — Other Ambulatory Visit (HOSPITAL_COMMUNITY): Payer: No Typology Code available for payment source

## 2017-07-02 ENCOUNTER — Ambulatory Visit (HOSPITAL_COMMUNITY): Payer: No Typology Code available for payment source | Attending: Cardiovascular Disease

## 2017-07-02 DIAGNOSIS — I4891 Unspecified atrial fibrillation: Secondary | ICD-10-CM | POA: Insufficient documentation

## 2017-07-02 DIAGNOSIS — I517 Cardiomegaly: Secondary | ICD-10-CM | POA: Diagnosis not present

## 2017-07-02 DIAGNOSIS — I119 Hypertensive heart disease without heart failure: Secondary | ICD-10-CM | POA: Diagnosis not present

## 2017-07-09 ENCOUNTER — Telehealth: Payer: Self-pay | Admitting: Internal Medicine

## 2017-07-09 NOTE — Telephone Encounter (Signed)
Records received from Dr.Ganji office. Placed in Chart Prep.  °

## 2017-07-16 ENCOUNTER — Telehealth: Payer: Self-pay | Admitting: Adult Health

## 2017-07-16 DIAGNOSIS — I517 Cardiomegaly: Secondary | ICD-10-CM

## 2017-07-16 NOTE — Telephone Encounter (Signed)
Copied from CRM [phone]. Topic: Quick Communication - Rx Refill/Question >> Jul 16, 2017  8:56 AM Arlyss Gandyichardson, Meshia Rau N, NT wrote: Medication: metoprolol succinate (TOPROL-XL) 25 MG 24 hr tablet  Has the patient contacted their pharmacy? Yes.   (Agent: If no, request that the patient contact the pharmacy for the refill.) Preferred Pharmacy (with phone number or street name): [facility] [place] [address] [phone] (Phone) [phone] (Fax)     Agent: Please be advised that RX refills may take up to 3 business days. We ask that you follow-up with your pharmacy.

## 2017-07-17 MED ORDER — METOPROLOL SUCCINATE ER 25 MG PO TB24: 25.0000 mg | ORAL_TABLET | Freq: Every day | ORAL | 3 refills | Status: DC

## 2017-07-17 NOTE — Telephone Encounter (Signed)
Refill request for Toprol-XL 25 MG tablet. LOV 05/05/17 with PCP Shirline Freesory Nafziger, NP-C.

## 2017-07-20 ENCOUNTER — Ambulatory Visit (HOSPITAL_BASED_OUTPATIENT_CLINIC_OR_DEPARTMENT_OTHER): Payer: No Typology Code available for payment source | Attending: Internal Medicine | Admitting: Cardiology

## 2017-07-20 DIAGNOSIS — R0683 Snoring: Secondary | ICD-10-CM | POA: Diagnosis not present

## 2017-07-20 DIAGNOSIS — R9431 Abnormal electrocardiogram [ECG] [EKG]: Secondary | ICD-10-CM | POA: Diagnosis not present

## 2017-08-01 NOTE — Procedures (Signed)
    Patient Name: Shawn Becker, Shawn Becker Study Date:03/10/2017 07/20/2017 Gender: Male D.O.B: 07/04/1970 Age (years): 46 Referring Provider: Dietrich Pates Height (inches): 72 Interpreting Physician: Armanda Magic MD, ABSM Weight (lbs): 280 RPSGT: Ulyess Mort BMI: 38 MRN: [id number] Neck Size: 17.00  CLINICAL INFORMATION Sleep Study Type: NPSG  Indication for sleep study: Hypertension, Obesity, Snoring  Epworth Sleepiness Score: 5  SLEEP STUDY TECHNIQUE As per the AASM Manual for the Scoring of Sleep and Associated Events v2.3 (April 2016) with a hypopnea requiring 4% desaturations.  The channels recorded and monitored were frontal, central and occipital EEG, electrooculogram (EOG), submentalis EMG (chin), nasal and oral airflow, thoracic and abdominal wall motion, anterior tibialis EMG, snore microphone, electrocardiogram, and pulse oximetry.  MEDICATIONS Medications self-administered by patient taken the night of the study : N/A  SLEEP ARCHITECTURE The study was initiated at 10:15:10 PM and ended at 4:40:53 AM.  Sleep onset time was 28.2 minutes and the sleep efficiency was 87.0%%. The total sleep time was 335.5 minutes.  Stage REM latency was 64.5 minutes.  The patient spent 6.6%% of the night in stage N1 sleep, 81.2%% in stage N2 sleep, 0.0%% in stage N3 and 12.22% in REM.  Alpha intrusion was absent.  Supine sleep was 28.54%.  RESPIRATORY PARAMETERS The overall apnea/hypopnea index (AHI) was 1.1 per hour. There were 0 total apneas, including 0 obstructive, 0 central and 0 mixed apneas. There were 6 hypopneas and 11 RERAs.  The AHI during Stage REM sleep was 1.5 per hour.  AHI while supine was 1.3 per hour.  The mean oxygen saturation was 95.8%. The minimum SpO2 during sleep was 92.0%.  soft snoring was noted during this study.  CARDIAC DATA The 2 lead EKG demonstrated sinus rhythm. The mean heart rate was 80.2 beats per minute. Other EKG findings include:  None.  LEG MOVEMENT DATA The total PLMS were 0 with a resulting PLMS index of 0.0. Associated arousal with leg movement index was 0.5 .  IMPRESSIONS - No significant obstructive sleep apnea occurred during this study (AHI = 1.1/h). - No significant central sleep apnea occurred during this study (CAI = 0.0/h). - The patient had minimal or no oxygen desaturation during the study (Min O2 = 92.0%) - The patient snored with soft snoring volume. - No cardiac abnormalities were noted during this study. - Clinically significant periodic limb movements did not occur during sleep. No significant associated arousals.  DIAGNOSIS - Normal Study  RECOMMENDATIONS - Avoid alcohol, sedatives and other CNS depressants that may worsen sleep apnea and disrupt normal sleep architecture. - Sleep hygiene should be reviewed to assess factors that may improve sleep quality. - Weight management and regular exercise should be initiated or continued if appropriate.

## 2017-08-03 ENCOUNTER — Telehealth: Payer: Self-pay | Admitting: *Deleted

## 2017-08-03 NOTE — Telephone Encounter (Signed)
Informed patient of sleep study results and patient understanding was verbalized. Patient understands his sleep study showed no significant sleep apnea.   Pt is aware and agreeable to normal results. 

## 2017-08-03 NOTE — Telephone Encounter (Signed)
-----   Message from Quintella Reichert, MD sent at 08/01/2017  9:00 PM EDT ----- Please let patient know that sleep study showed no significant sleep apnea.

## 2018-03-24 HISTORY — PX: COLONOSCOPY: SHX174

## 2018-04-22 ENCOUNTER — Other Ambulatory Visit: Payer: Self-pay | Admitting: Adult Health

## 2018-04-22 DIAGNOSIS — I517 Cardiomegaly: Secondary | ICD-10-CM

## 2018-04-22 DIAGNOSIS — I10 Essential (primary) hypertension: Secondary | ICD-10-CM

## 2018-04-23 NOTE — Telephone Encounter (Signed)
Sent to the pharmacy by e-scribe. 

## 2018-07-27 ENCOUNTER — Other Ambulatory Visit: Payer: Self-pay | Admitting: Internal Medicine

## 2018-09-08 ENCOUNTER — Other Ambulatory Visit: Payer: Self-pay | Admitting: Adult Health

## 2018-09-08 DIAGNOSIS — I10 Essential (primary) hypertension: Secondary | ICD-10-CM

## 2018-09-08 DIAGNOSIS — I517 Cardiomegaly: Secondary | ICD-10-CM

## 2018-09-09 NOTE — Telephone Encounter (Signed)
Denied  Needs appointment

## 2018-09-16 ENCOUNTER — Encounter: Payer: Self-pay | Admitting: Adult Health

## 2018-09-16 ENCOUNTER — Ambulatory Visit (INDEPENDENT_AMBULATORY_CARE_PROVIDER_SITE_OTHER): Payer: 59 | Admitting: Adult Health

## 2018-09-16 ENCOUNTER — Other Ambulatory Visit: Payer: Self-pay

## 2018-09-16 DIAGNOSIS — I10 Essential (primary) hypertension: Secondary | ICD-10-CM | POA: Diagnosis not present

## 2018-09-16 DIAGNOSIS — I517 Cardiomegaly: Secondary | ICD-10-CM

## 2018-09-16 DIAGNOSIS — E063 Autoimmune thyroiditis: Secondary | ICD-10-CM

## 2018-09-16 MED ORDER — LEVOTHYROXINE SODIUM 25 MCG PO TABS: 25.0000 ug | ORAL_TABLET | Freq: Every day | ORAL | 0 refills | Status: DC

## 2018-09-16 MED ORDER — METOPROLOL SUCCINATE ER 25 MG PO TB24: 25.0000 mg | ORAL_TABLET | Freq: Every day | ORAL | 0 refills | Status: DC

## 2018-09-16 MED ORDER — LOSARTAN POTASSIUM-HCTZ 100-12.5 MG PO TABS: 1.0000 | ORAL_TABLET | Freq: Every day | ORAL | 0 refills | Status: DC

## 2018-09-16 NOTE — Progress Notes (Signed)
Virtual Visit via Video Note  I connected with Shawn Becker on 09/16/18 at  9:00 AM EDT by a video enabled telemedicine application and verified that I am speaking with the correct person using two identifiers.  Location patient: home Location provider:work or home office Persons participating in the virtual visit: patient, provider  I discussed the limitations of evaluation and management by telemedicine and the availability of in person appointments. The patient expressed understanding and agreed to proceed.   HPI: 48 year old male who is being evaluated today for follow-up regarding essential hypertension.  He is currently prescribed Hyzaar 100-12.5 mg and Toprol 25 mg.  He needs a refill of both medications.  He reports that he has not been taking Toprol for some time now as this prescription ran out and he never got it refilled.  Is been monitoring his blood pressures at home periodically and reports readings consistently in the 140s over 90s to 100s.  He is asymptomatic.  BP Readings from Last 3 Encounters:  05/25/17 134/86  05/05/17 (!) 140/100  04/14/17 140/90    ROS: See pertinent positives and negatives per HPI.  Past Medical History:  Diagnosis Date  . Allergy   . Hypertension   . Hypothyroidism   . Left ventricular hypertrophy     No past surgical history on file.  Family History  Problem Relation Age of Onset  . Diabetes Mother   . Hypertension Mother   . Mental illness Mother   . Diabetes Maternal Grandmother   . Hypertension Maternal Grandmother   . Cancer Maternal Grandfather        colon cancer  . Hyperlipidemia Paternal Grandmother   . Cancer Paternal Grandfather        prostate cancer      Current Outpatient Medications:  .  levothyroxine (SYNTHROID) 25 MCG tablet, Take 1 tablet (25 mcg total) by mouth daily before breakfast., Disp: 90 tablet, Rfl: 0 .  losartan-hydrochlorothiazide (HYZAAR) 100-12.5 MG tablet, Take 1 tablet by mouth daily., Disp:  90 tablet, Rfl: 0 .  metoprolol succinate (TOPROL-XL) 25 MG 24 hr tablet, Take 1 tablet (25 mg total) by mouth daily., Disp: 90 tablet, Rfl: 0  EXAM:  VITALS per patient if applicable:  GENERAL: alert, oriented, appears well and in no acute distress  HEENT: atraumatic, conjunttiva clear, no obvious abnormalities on inspection of external nose and ears  NECK: normal movements of the head and neck  LUNGS: on inspection no signs of respiratory distress, breathing rate appears normal, no obvious gross SOB, gasping or wheezing  CV: no obvious cyanosis  MS: moves all visible extremities without noticeable abnormality  PSYCH/NEURO: pleasant and cooperative, no obvious depression or anxiety, speech and thought processing grossly intact  ASSESSMENT AND PLAN:  Discussed the following assessment and plan:  I will refill his medications today.  Would like him to follow-up within the next 90 days for his CPE.  Take both medications for blood pressure control and monitor blood pressure more frequently at home.  Bring log to next appointment  1. Left ventricular hypertrophy  - losartan-hydrochlorothiazide (HYZAAR) 100-12.5 MG tablet; Take 1 tablet by mouth daily.  Dispense: 90 tablet; Refill: 0 - metoprolol succinate (TOPROL-XL) 25 MG 24 hr tablet; Take 1 tablet (25 mg total) by mouth daily.  Dispense: 90 tablet; Refill: 0  2. Essential hypertension  - losartan-hydrochlorothiazide (HYZAAR) 100-12.5 MG tablet; Take 1 tablet by mouth daily.  Dispense: 90 tablet; Refill: 0 - metoprolol succinate (TOPROL-XL) 25 MG 24 hr tablet;  Take 1 tablet (25 mg total) by mouth daily.  Dispense: 90 tablet; Refill: 0  3. Hashimoto's thyroiditis  - levothyroxine (SYNTHROID) 25 MCG tablet; Take 1 tablet (25 mcg total) by mouth daily before breakfast.  Dispense: 90 tablet; Refill: 0     I discussed the assessment and treatment plan with the patient. The patient was provided an opportunity to ask questions and  all were answered. The patient agreed with the plan and demonstrated an understanding of the instructions.   The patient was advised to call back or seek an in-person evaluation if the symptoms worsen or if the condition fails to improve as anticipated.   Dorothyann Peng, NP

## 2018-12-19 ENCOUNTER — Other Ambulatory Visit: Payer: Self-pay | Admitting: Adult Health

## 2018-12-19 DIAGNOSIS — I10 Essential (primary) hypertension: Secondary | ICD-10-CM

## 2018-12-19 DIAGNOSIS — I517 Cardiomegaly: Secondary | ICD-10-CM

## 2018-12-21 NOTE — Telephone Encounter (Signed)
DENIED.  NEEDS TO BE SCHEDULED FOR CPX. 

## 2018-12-24 ENCOUNTER — Other Ambulatory Visit: Payer: Self-pay | Admitting: Adult Health

## 2018-12-24 DIAGNOSIS — I517 Cardiomegaly: Secondary | ICD-10-CM

## 2018-12-24 DIAGNOSIS — I10 Essential (primary) hypertension: Secondary | ICD-10-CM

## 2018-12-28 NOTE — Telephone Encounter (Signed)
SENT TO THE PHARMACY BY E-SCRIBE FOR 90 DAYS.  PT SCHEDULED FOR BP FOLLOW UP. NEEDS CPX.

## 2019-01-05 ENCOUNTER — Other Ambulatory Visit: Payer: Self-pay

## 2019-01-05 ENCOUNTER — Ambulatory Visit (INDEPENDENT_AMBULATORY_CARE_PROVIDER_SITE_OTHER): Payer: 59 | Admitting: Adult Health

## 2019-01-05 ENCOUNTER — Encounter: Payer: Self-pay | Admitting: Adult Health

## 2019-01-05 VITALS — BP 126/86 | Temp 97.9°F | Wt 296.0 lb

## 2019-01-05 DIAGNOSIS — I10 Essential (primary) hypertension: Secondary | ICD-10-CM | POA: Diagnosis not present

## 2019-01-05 DIAGNOSIS — I517 Cardiomegaly: Secondary | ICD-10-CM | POA: Diagnosis not present

## 2019-01-05 DIAGNOSIS — E063 Autoimmune thyroiditis: Secondary | ICD-10-CM

## 2019-01-05 DIAGNOSIS — Z0000 Encounter for general adult medical examination without abnormal findings: Secondary | ICD-10-CM | POA: Diagnosis not present

## 2019-01-05 DIAGNOSIS — E6689 Other obesity not elsewhere classified: Secondary | ICD-10-CM

## 2019-01-05 DIAGNOSIS — Z125 Encounter for screening for malignant neoplasm of prostate: Secondary | ICD-10-CM

## 2019-01-05 DIAGNOSIS — E668 Other obesity: Secondary | ICD-10-CM

## 2019-01-05 MED ORDER — LOSARTAN POTASSIUM-HCTZ 100-12.5 MG PO TABS: 1.0000 | ORAL_TABLET | Freq: Every day | ORAL | 3 refills | Status: DC

## 2019-01-05 MED ORDER — METOPROLOL SUCCINATE ER 25 MG PO TB24: 25.0000 mg | ORAL_TABLET | Freq: Every day | ORAL | 3 refills | Status: DC

## 2019-01-05 MED ORDER — LEVOTHYROXINE SODIUM 25 MCG PO TABS: 25.0000 ug | ORAL_TABLET | Freq: Every day | ORAL | 3 refills | Status: DC

## 2019-01-05 NOTE — Patient Instructions (Signed)
It was great seeing you today   Please schedule a lab visit for your physical labs   Someone from the weight loss clinic will call you to schedule your initial appointment

## 2019-01-05 NOTE — Progress Notes (Signed)
Subjective:    Patient ID: Shawn Becker, male    DOB: 07/22/1970, 48 y.o.   MRN: [id number]  HPI Patient presents for yearly preventative medicine examination. He is a pleasant 48 year old male who  has a past medical history of Allergy, Hypertension, Hypothyroidism, and Left ventricular hypertrophy.  Essential Hypertension/LVH -currently well controlled on Hyzaar 100-12.5 mg and Toprol 25 mg.  He is followed by cardiology for LVH.  His last echo in 06/2017 showed no significant valvular abnormalities, very mild thickening of the heart, with normal EF.  Denies chest pain, shortness of breath, dizziness, lightheadedness or syncopal episodes BP Readings from Last 3 Encounters:  01/05/19 126/86  05/25/17 134/86  05/05/17 (!) 140/100   Hashimoto's thyroiditis -Controlled on Synthroid 25 mcg daily  Obesity -reports that he has not been exercising due to the gyms being closed and his diet has been "terrible".  He is eating a lot of carbs and sugars.  His weight is up about 16 pounds since April 2019.  He is interested in being seen by weight loss clinic  Wt Readings from Last 3 Encounters:  01/05/19 296 lb (134.3 kg)  07/20/17 280 lb (127 kg)  05/25/17 291 lb 6.4 oz (132.2 kg)   All immunizations and health maintenance protocols were reviewed with the patient and needed orders were placed. Refuses flu shot   Appropriate screening laboratory values were ordered for the patient including screening of hyperlipidemia, renal function and hepatic function. If indicated by BPH, a PSA was ordered.  Medication reconciliation,  past medical history, social history, problem list and allergies were reviewed in detail with the patient  Goals were established with regard to weight loss, exercise, and  diet in compliance with medications.   Review of Systems  Constitutional: Negative.   HENT: Negative.   Eyes: Negative.   Respiratory: Negative.   Cardiovascular: Negative.    Gastrointestinal: Negative.   Endocrine: Negative.   Genitourinary: Negative.   Musculoskeletal: Negative.   Skin: Negative.   Allergic/Immunologic: Negative.   Neurological: Negative.   Hematological: Negative.   Psychiatric/Behavioral: Negative.   All other systems reviewed and are negative.  Past Medical History:  Diagnosis Date  . Allergy   . Hypertension   . Hypothyroidism   . Left ventricular hypertrophy     Social History   Socioeconomic History  . Marital status: Married    Spouse name: Not on file  . Number of children: Not on file  . Years of education: Not on file  . Highest education level: Not on file  Occupational History  . Not on file  Social Needs  . Financial resource strain: Not on file  . Food insecurity    Worry: Not on file    Inability: Not on file  . Transportation needs    Medical: Not on file    Non-medical: Not on file  Tobacco Use  . Smoking status: Never Smoker  . Smokeless tobacco: Never Used  Substance and Sexual Activity  . Alcohol use: No    Alcohol/week: 0.0 standard drinks  . Drug use: No  . Sexual activity: Not on file  Lifestyle  . Physical activity    Days per week: Not on file    Minutes per session: Not on file  . Stress: Not on file  Relationships  . Social Herbalist on phone: Not on file    Gets together: Not on file    Attends religious service: Not  on file    Active member of club or organization: Not on file    Attends meetings of clubs or organizations: Not on file    Relationship status: Not on file  . Intimate partner violence    Fear of current or ex partner: Not on file    Emotionally abused: Not on file    Physically abused: Not on file    Forced sexual activity: Not on file  Other Topics Concern  . Not on file  Social History Narrative   Renato Gails    Married    4 children          He likes to ride his motorcycle.        History reviewed. No pertinent surgical history.  Family  History  Problem Relation Age of Onset  . Diabetes Mother   . Hypertension Mother   . Mental illness Mother   . Diabetes Maternal Grandmother   . Hypertension Maternal Grandmother   . Cancer Maternal Grandfather        colon cancer  . Hyperlipidemia Paternal Grandmother   . Cancer Paternal Grandfather        prostate cancer    Allergies  Allergen Reactions  . Penicillins     No current outpatient medications on file prior to visit.   No current facility-administered medications on file prior to visit.     BP 126/86   Temp 97.9 F (36.6 C)   Wt 296 lb (134.3 kg)   BMI 40.14 kg/m       Objective:   Physical Exam Vitals signs and nursing note reviewed.  Constitutional:      General: He is not in acute distress.    Appearance: He is well-developed. He is obese. He is not diaphoretic.  HENT:     Head: Normocephalic and atraumatic.     Right Ear: Tympanic membrane, ear canal and external ear normal. There is no impacted cerumen.     Left Ear: Tympanic membrane, ear canal and external ear normal. There is no impacted cerumen.     Nose: Nose normal. No congestion or rhinorrhea.     Mouth/Throat:     Mouth: Mucous membranes are moist.     Pharynx: Oropharynx is clear. No oropharyngeal exudate or posterior oropharyngeal erythema.  Eyes:     General: No scleral icterus.       Right eye: No discharge.        Left eye: No discharge.     Conjunctiva/sclera: Conjunctivae normal.     Pupils: Pupils are equal, round, and reactive to light.  Neck:     Thyroid: No thyromegaly.     Vascular: No carotid bruit.     Trachea: No tracheal deviation.  Cardiovascular:     Rate and Rhythm: Normal rate and regular rhythm.     Pulses: Normal pulses.     Heart sounds: Normal heart sounds. No murmur. No friction rub. No gallop.   Pulmonary:     Effort: Pulmonary effort is normal. No respiratory distress.     Breath sounds: Normal breath sounds. No stridor. No wheezing, rhonchi or  rales.  Chest:     Chest wall: No tenderness.  Abdominal:     General: Bowel sounds are normal. There is no distension.     Palpations: Abdomen is soft. There is no mass.     Tenderness: There is no abdominal tenderness. There is no right CVA tenderness, left CVA tenderness, guarding or rebound.  Hernia: No hernia is present.  Musculoskeletal: Normal range of motion.        General: No swelling, tenderness, deformity or signs of injury.     Right lower leg: No edema.  Lymphadenopathy:     Cervical: No cervical adenopathy.  Skin:    General: Skin is warm and dry.     Capillary Refill: Capillary refill takes less than 2 seconds.     Coloration: Skin is not jaundiced or pale.     Findings: No bruising, erythema, lesion or rash.  Neurological:     General: No focal deficit present.     Mental Status: He is alert and oriented to person, place, and time. Mental status is at baseline.     Cranial Nerves: No cranial nerve deficit.     Sensory: No sensory deficit.     Motor: No weakness.     Coordination: Coordination normal.     Gait: Gait normal.     Deep Tendon Reflexes: Reflexes normal.  Psychiatric:        Mood and Affect: Mood normal.        Behavior: Behavior normal.        Thought Content: Thought content normal.        Judgment: Judgment normal.       Assessment & Plan:  1. Routine general medical examination at a health care facility - levothyroxine (SYNTHROID) 25 MCG tablet; Take 1 tablet (25 mcg total) by mouth daily before breakfast.  Dispense: 90 tablet; Refill: 3 - losartan-hydrochlorothiazide (HYZAAR) 100-12.5 MG tablet; Take 1 tablet by mouth daily.  Dispense: 90 tablet; Refill: 3 - metoprolol succinate (TOPROL-XL) 25 MG 24 hr tablet; Take 1 tablet (25 mg total) by mouth daily.  Dispense: 90 tablet; Refill: 3 - CBC with Differential/Platelet; Future - Comprehensive metabolic panel; Future - Hemoglobin A1c; Future - Lipid panel; Future - PSA; Future - TSH;  Future  2. Hashimoto's thyroiditis - Consider synthroid dose change  - levothyroxine (SYNTHROID) 25 MCG tablet; Take 1 tablet (25 mcg total) by mouth daily before breakfast.  Dispense: 90 tablet; Refill: 3 - CBC with Differential/Platelet; Future - Comprehensive metabolic panel; Future - Hemoglobin A1c; Future - Lipid panel; Future - TSH; Future  3. Essential hypertension - Well controlled. No change in medications  - losartan-hydrochlorothiazide (HYZAAR) 100-12.5 MG tablet; Take 1 tablet by mouth daily.  Dispense: 90 tablet; Refill: 3 - metoprolol succinate (TOPROL-XL) 25 MG 24 hr tablet; Take 1 tablet (25 mg total) by mouth daily.  Dispense: 90 tablet; Refill: 3 - CBC with Differential/Platelet; Future - Comprehensive metabolic panel; Future - Hemoglobin A1c; Future - Lipid panel; Future - TSH; Future  4. Left ventricular hypertrophy -Follow-up with cardiology as directed - losartan-hydrochlorothiazide (HYZAAR) 100-12.5 MG tablet; Take 1 tablet by mouth daily.  Dispense: 90 tablet; Refill: 3 - metoprolol succinate (TOPROL-XL) 25 MG 24 hr tablet; Take 1 tablet (25 mg total) by mouth daily.  Dispense: 90 tablet; Refill: 3 - CBC with Differential/Platelet; Future - Comprehensive metabolic panel; Future - Hemoglobin A1c; Future - Lipid panel; Future - TSH; Future  5. Other obesity -Encouraged to start exercising, even if it is walking.  Educated on Mediterranean diet.  Refrain from high carbs, high sugars, fast foods, or fried foods - CBC with Differential/Platelet; Future - Comprehensive metabolic panel; Future - Hemoglobin A1c; Future - Lipid panel; Future - TSH; Future - Amb Ref to Medical Weight Management  6. Prostate cancer screening  - PSA; Future  Shirline Freesory Raeford Brandenburg,  NP

## 2019-01-06 ENCOUNTER — Other Ambulatory Visit (INDEPENDENT_AMBULATORY_CARE_PROVIDER_SITE_OTHER): Payer: 59

## 2019-01-06 ENCOUNTER — Telehealth: Payer: Self-pay | Admitting: Adult Health

## 2019-01-06 DIAGNOSIS — I517 Cardiomegaly: Secondary | ICD-10-CM | POA: Diagnosis not present

## 2019-01-06 DIAGNOSIS — I10 Essential (primary) hypertension: Secondary | ICD-10-CM

## 2019-01-06 DIAGNOSIS — Z0000 Encounter for general adult medical examination without abnormal findings: Secondary | ICD-10-CM

## 2019-01-06 DIAGNOSIS — E063 Autoimmune thyroiditis: Secondary | ICD-10-CM

## 2019-01-06 DIAGNOSIS — Z125 Encounter for screening for malignant neoplasm of prostate: Secondary | ICD-10-CM | POA: Diagnosis not present

## 2019-01-06 DIAGNOSIS — E668 Other obesity: Secondary | ICD-10-CM

## 2019-01-06 LAB — CBC WITH DIFFERENTIAL/PLATELET
Basophils Absolute: 0 10*3/uL (ref 0.0–0.1)
Basophils Relative: 0.4 % (ref 0.0–3.0)
Eosinophils Absolute: 0.1 10*3/uL (ref 0.0–0.7)
Eosinophils Relative: 3.3 % (ref 0.0–5.0)
HCT: 42.5 % (ref 39.0–52.0)
Hemoglobin: 13.9 g/dL (ref 13.0–17.0)
Lymphocytes Relative: 40.9 % (ref 12.0–46.0)
Lymphs Abs: 1.7 10*3/uL (ref 0.7–4.0)
MCHC: 32.8 g/dL (ref 30.0–36.0)
MCV: 80.4 fl (ref 78.0–100.0)
Monocytes Absolute: 0.5 10*3/uL (ref 0.1–1.0)
Monocytes Relative: 12.4 % — ABNORMAL HIGH (ref 3.0–12.0)
Neutro Abs: 1.8 10*3/uL (ref 1.4–7.7)
Neutrophils Relative %: 43 % (ref 43.0–77.0)
Platelets: 232 10*3/uL (ref 150.0–400.0)
RBC: 5.28 Mil/uL (ref 4.22–5.81)
RDW: 15.2 % (ref 11.5–15.5)
WBC: 4.2 10*3/uL (ref 4.0–10.5)

## 2019-01-06 LAB — TSH: TSH: 7.42 u[IU]/mL — ABNORMAL HIGH (ref 0.35–4.50)

## 2019-01-06 LAB — COMPREHENSIVE METABOLIC PANEL
ALT: 55 U/L — ABNORMAL HIGH (ref 0–53)
AST: 39 U/L — ABNORMAL HIGH (ref 0–37)
Albumin: 4.4 g/dL (ref 3.5–5.2)
Alkaline Phosphatase: 68 U/L (ref 39–117)
BUN: 12 mg/dL (ref 6–23)
CO2: 29 mEq/L (ref 19–32)
Calcium: 9.3 mg/dL (ref 8.4–10.5)
Chloride: 102 mEq/L (ref 96–112)
Creatinine, Ser: 1.26 mg/dL (ref 0.40–1.50)
GFR: 74.61 mL/min (ref >60.00)
Glucose, Bld: 102 mg/dL — ABNORMAL HIGH (ref 70–99)
Potassium: 4.7 mEq/L (ref 3.5–5.1)
Sodium: 138 mEq/L (ref 135–145)
Total Bilirubin: 0.5 mg/dL (ref 0.2–1.2)
Total Protein: 7.5 g/dL (ref 6.0–8.3)

## 2019-01-06 LAB — LIPID PANEL
Cholesterol: 147 mg/dL (ref 0–200)
HDL: 36.8 mg/dL — ABNORMAL LOW (ref >39.00)
LDL Cholesterol: 82 mg/dL (ref 0–99)
NonHDL: 110.32
Total CHOL/HDL Ratio: 4
Triglycerides: 144 mg/dL (ref 0.0–149.0)
VLDL: 28.8 mg/dL (ref 0.0–40.0)

## 2019-01-06 LAB — HEMOGLOBIN A1C: Hgb A1c MFr Bld: 6.5 % (ref 4.6–6.5)

## 2019-01-06 LAB — PSA: PSA: 0.31 ng/mL (ref 0.10–4.00)

## 2019-01-06 MED ORDER — METFORMIN HCL 500 MG PO TABS: 250.0000 mg | ORAL_TABLET | Freq: Every day | ORAL | 1 refills | Status: DC

## 2019-01-06 MED ORDER — LEVOTHYROXINE SODIUM 50 MCG PO TABS: 50.0000 ug | ORAL_TABLET | Freq: Every day | ORAL | 3 refills | Status: DC

## 2019-01-06 NOTE — Telephone Encounter (Signed)
Spoke to patient and informed him of his labs.  His A1c was 6.5 I am going to start him on metformin 250 mg daily  TSH was also elevated we will increase Synthroid  He was made aware of elevated liver enzymes, he needs to start working on lifestyle modification

## 2019-03-08 ENCOUNTER — Telehealth: Payer: Self-pay

## 2019-03-08 NOTE — Telephone Encounter (Signed)
Copied from [place] [id number]. Topic: General - Inquiry >> Mar 08, 2019  3:44 PM Alease Frame wrote: Reason for CRM: Patient wanted a call back from office regarding his blood type . Please advise

## 2019-03-09 NOTE — Telephone Encounter (Signed)
Spoke to [place] and advised that his blood type is unknown to Primary Care.  Advised that he give blood or contact the surgeon's office if he has had surgery in the past.  Nothing further needed.

## 2019-04-05 ENCOUNTER — Telehealth: Payer: Self-pay | Admitting: *Deleted

## 2019-04-05 NOTE — Telephone Encounter (Signed)
Copied from CRM [phone] [id number]. Topic: General - Other >> Apr 05, 2019 10:30 AM Jaquita Rector A wrote: Reason for CRM: Pharmacy called to get permission from Dr Kandee Keen to change manufacturer of levothyroxine (SYNTHROID) 50 MCG tablet. Need a call back if this is ok Ph# [phone]

## 2019-04-05 NOTE — Telephone Encounter (Signed)
Ok to change manufacturers but please have him come in for repeat TSH in 3 weeks after making the switch

## 2019-04-05 NOTE — Telephone Encounter (Signed)
Called pharmacy to confirm that manufacturer switch of medication is okay per [facility]. Left message for patient to return call to office to schedule lab appointment for 3 weeks.

## 2019-04-06 ENCOUNTER — Other Ambulatory Visit: Payer: Self-pay | Admitting: *Deleted

## 2019-04-06 DIAGNOSIS — E063 Autoimmune thyroiditis: Secondary | ICD-10-CM

## 2019-04-06 NOTE — Telephone Encounter (Signed)
Spoke with patient, lab appointment made and order placed for TSH.

## 2019-04-26 ENCOUNTER — Other Ambulatory Visit (INDEPENDENT_AMBULATORY_CARE_PROVIDER_SITE_OTHER): Payer: BC Managed Care – PPO

## 2019-04-26 ENCOUNTER — Other Ambulatory Visit: Payer: Self-pay

## 2019-04-26 DIAGNOSIS — E063 Autoimmune thyroiditis: Secondary | ICD-10-CM

## 2019-04-26 LAB — TSH: TSH: 3.81 u[IU]/mL (ref 0.35–4.50)

## 2019-05-02 ENCOUNTER — Encounter (INDEPENDENT_AMBULATORY_CARE_PROVIDER_SITE_OTHER): Payer: Self-pay | Admitting: Bariatrics

## 2019-05-02 ENCOUNTER — Other Ambulatory Visit: Payer: Self-pay

## 2019-05-02 ENCOUNTER — Ambulatory Visit (INDEPENDENT_AMBULATORY_CARE_PROVIDER_SITE_OTHER): Payer: Self-pay | Admitting: Bariatrics

## 2019-05-02 VITALS — BP 128/86 | HR 92 | Temp 98.4°F | Ht 72.0 in | Wt 295.0 lb

## 2019-05-02 DIAGNOSIS — R7989 Other specified abnormal findings of blood chemistry: Secondary | ICD-10-CM

## 2019-05-02 DIAGNOSIS — E119 Type 2 diabetes mellitus without complications: Secondary | ICD-10-CM

## 2019-05-02 DIAGNOSIS — Z0289 Encounter for other administrative examinations: Secondary | ICD-10-CM

## 2019-05-02 DIAGNOSIS — R5383 Other fatigue: Secondary | ICD-10-CM

## 2019-05-02 DIAGNOSIS — E559 Vitamin D deficiency, unspecified: Secondary | ICD-10-CM

## 2019-05-02 DIAGNOSIS — I10 Essential (primary) hypertension: Secondary | ICD-10-CM

## 2019-05-02 DIAGNOSIS — R0602 Shortness of breath: Secondary | ICD-10-CM

## 2019-05-02 DIAGNOSIS — E786 Lipoprotein deficiency: Secondary | ICD-10-CM

## 2019-05-02 DIAGNOSIS — Z9189 Other specified personal risk factors, not elsewhere classified: Secondary | ICD-10-CM

## 2019-05-02 DIAGNOSIS — Z1331 Encounter for screening for depression: Secondary | ICD-10-CM

## 2019-05-02 DIAGNOSIS — E038 Other specified hypothyroidism: Secondary | ICD-10-CM

## 2019-05-02 DIAGNOSIS — Z6841 Body Mass Index (BMI) 40.0 and over, adult: Secondary | ICD-10-CM

## 2019-05-02 NOTE — Progress Notes (Signed)
Dear Dr. Shirline Becker,   Thank you for referring Shawn Becker to our clinic. The following note includes my evaluation and treatment recommendations.  Chief Complaint:   Shawn Becker (MR# [id number]) is a 49 y.o. male who presents for evaluation and treatment of Shawn and related comorbidities. Current BMI is Body mass index is 40.01 kg/m.Marland Kitchen Shawn Becker has been struggling with his weight for many years and has been unsuccessful in either losing weight, maintaining weight loss, or reaching his healthy weight goal.  Shawn Becker is currently in the action stage of change and ready to dedicate time achieving and maintaining a healthier weight. Shawn Becker is interested in becoming our patient and working on intensive lifestyle modifications including (but not limited to) diet and exercise for weight loss.  Shawn Becker has nighttime cravings. He snacks on cake, Skittles, and chips.  Shawn Becker habits were reviewed today and are as follows: His family eats meals together, he thinks his family will eat healthier with him, his desired weight loss is 70 lbs, he has been heavy most of his life, his heaviest weight ever was 295 pounds, he craves sweets, breads, and burgers, he snacks frequently in the evenings, he is frequently drinking liquids with calories, he frequently makes poor food choices, he frequently eats larger portions than normal, he has binge eating behaviors and he struggles with emotional eating.  Depression Screen Shawn Becker's Food and Mood (modified PHQ-9) score was 6.  Depression screen PHQ 2/9 05/02/2019  Decreased Interest 1  Down, Depressed, Hopeless 0  PHQ - 2 Score 1  Altered sleeping 1  Tired, decreased energy 3  Change in appetite 0  Feeling bad or failure about yourself  0  Trouble concentrating 1  Moving slowly or fidgety/restless 0  Suicidal thoughts 0  PHQ-9 Score 6  Difficult doing work/chores Somewhat difficult    Subjective:   Other fatigue. Shawn Becker denies daytime somnolence and admits to waking up still tired. Shawn Becker generally gets 7-8 hours of sleep per night, and states that he has generally restful sleep. Snoring is present. Apneic episodes are not present. Epworth Sleepiness Score is 6.  Shortness of breath on exertion. Shawn Becker notes increasing shortness of breath with certain activities and seems to be worsening over time with weight gain. He notes getting out of breath sooner with activity than he used to. This has gotten worse recently. Shawn Becker denies shortness of breath at rest or orthopnea.  Other specified hypothyroidism. Shawn Becker has a history of Hashimoto's thyroiditis. He is taking levothyroxine.  Lab Results  Component Value Date   TSH 3.81 04/26/2019   Essential hypertension. Shawn Becker is taking losartan (did not take medication today). Hypertension is reasonably well controlled.  BP Readings from Last 3 Encounters:  05/02/19 128/86  01/05/19 126/86  05/25/17 134/86   Lab Results  Component Value Date   CREATININE 1.26 01/06/2019   CREATININE 1.19 05/05/2017   CREATININE 1.39 (H) 04/15/2016   Vitamin D deficiency. Shawn Becker is taking OTC Vitamin D.  Type 2 diabetes mellitus without complication, without long-term current use of insulin (HCC). No polyphagia. He is taking metformin.  Lab Results  Component Value Date   HGBA1C 6.5 01/06/2019   HGBA1C 5.8 04/15/2016   HGBA1C 6.0 04/11/2015   Lab Results  Component Value Date   LDLCALC 82 01/06/2019   CREATININE 1.26 01/06/2019   No results found for: INSULIN  Elevated LFTs. Shawn Becker has a new dx of elevated ALT. His BMI is over 40. He denies abdominal pain  or jaundice and has never been told of any liver problems in the past. He denies excessive alcohol intake. No Tylenol.  Lab Results  Component Value Date   ALT 55 (H) 01/06/2019   AST 39 (H) 01/06/2019   ALKPHOS 68 01/06/2019    BILITOT 0.5 01/06/2019   Low HDL (under 40). Reminder of cholesterol panel was normal.  Lab Results  Component Value Date   CHOL 147 01/06/2019   HDL 36.80 (L) 01/06/2019   LDLCALC 82 01/06/2019   TRIG 144.0 01/06/2019   CHOLHDL 4 01/06/2019   Lab Results  Component Value Date   ALT 55 (H) 01/06/2019   AST 39 (H) 01/06/2019   ALKPHOS 68 01/06/2019   BILITOT 0.5 01/06/2019   The 10-year ASCVD risk score Shawn Becker DC Jr., et al., 2013) is: 13.3%   Values used to calculate the score:     Age: 82 years     Sex: Male     Is Non-Hispanic African American: Yes     Diabetic: Yes     Tobacco smoker: No     Systolic Blood Pressure: 128 mmHg     Is BP treated: Yes     HDL Cholesterol: 36.8 mg/dL     Total Cholesterol: 147 mg/dL  At risk for hypoglycemia. Shawn Becker is at increased risk for hypoglycemia due to carbohydrates and mixed protein. Shawn Becker is not currently taking insulin.   Depression screening. Shawn Becker had a mildly positive depression screen with a PHQ-9 score of 6.  Assessment/Plan:   Other fatigue. Shawn Becker does feel that his weight is causing his energy to be lower than it should be. Fatigue may be related to Shawn, depression or many other causes. Labs will be ordered, and in the meanwhile, Shawn Becker will focus on self care including making healthy food choices, increasing physical activity and focusing on stress reduction. EKG 12-Lead ordered.  Shortness of breath on exertion. Shawn Becker does feel that he gets out of breath more easily that he used to when he exercises. Shawn Becker shortness of breath appears to be Shawn related and exercise induced. He has agreed to work on weight loss and gradually increase exercise to treat his exercise induced shortness of breath. Will continue to monitor closely.  Other specified hypothyroidism. Patient with long-standing hypothyroidism, on levothyroxine therapy. He appears euthyroid. Orders and follow up as  documented in patient record. Shawn Becker will continue his medications as prescribed.  Counseling . Good thyroid control is important for overall health. Shawn Becker thyroid levels are dangerous and will not improve weight loss results. . The correct way to take levothyroxine is fasting, with water, separated by at least 30 minutes from breakfast, and separated by more than 4 hours from calcium, iron, multivitamins, acid reflux medications (PPIs).   Essential hypertension. Shawn Becker is working on healthy weight loss and exercise to improve blood pressure control. We will watch for signs of hypotension as he continues his lifestyle modifications. He will continue his medications as prescribed.  Vitamin D deficiency. Low Vitamin D level contributes to fatigue and are associated with Shawn, breast, and colon cancer. VITAMIN D 25 Hydroxy (Vit-D Deficiency, Fractures) level ordered.  Type 2 diabetes mellitus without complication, without long-term current use of insulin (HCC). Good blood sugar control is important to decrease the likelihood of diabetic complications such as nephropathy, neuropathy, limb loss, blindness, coronary artery disease, and death. Intensive lifestyle modification including diet, exercise and weight loss are the first line of treatment for diabetes. Shawn Becker will continue metformin. Comprehensive metabolic  panel, Hemoglobin A1c, Insulin, random labs ordered.  Elevated LFTs. We discussed the likely diagnosis of non-alcoholic fatty liver disease today and how this condition is Shawn related. Shawn Becker was educated the importance of weight loss. Shawn Becker agreed to continue with his weight loss efforts with healthier diet and exercise as an essential part of his treatment plan. CMP ordered.  Low HDL (under 40). Cardiovascular risk and specific lipid/LDL goals reviewed.  We discussed several lifestyle modifications today and Shawn Becker will continue to work on diet,  exercise and weight loss efforts. Orders and follow up as documented in patient record. Levels will be rechecked in the future.  Counseling Intensive lifestyle modifications are the first line treatment for this issue. . Dietary changes: Increase soluble fiber. Decrease simple carbohydrates. . Exercise changes: Moderate to vigorous-intensity aerobic activity 150 minutes per week if tolerated. . Lipid-lowering medications: see documented in medical record.  At risk for hypoglycemia. Shawn Becker was given approximately 15 minutes of counseling today regarding prevention of hypoglycemia. He was advised of symptoms of hypoglycemia. Shawn Becker was instructed to avoid skipping meals, eat regular protein rich meals and schedule low calorie snacks as needed.   Repetitive spaced learning was employed today to elicit superior memory formation and behavioral change.  Depression screening. Shawn Becker had a mildly positive depression screening. Depression is commonly associated with Shawn and often results in emotional eating behaviors. We will monitor this closely and work on CBT to help improve the non-hunger eating patterns. Referral to Psychology may be required if no improvement is seen as he continues in our clinic.  Class 3 severe Shawn with serious comorbidity and body mass index (BMI) of 40.0 to 44.9 in adult, unspecified Shawn type (HCC).  Kaid is currently in the action stage of change and his goal is to continue with weight loss efforts. I recommend Jamaurion begin the structured treatment plan as follows:  He has agreed to the Category 3 Plan.  He will work on meal planning and will stop all sugary drinks.  We independently reviewed labs with the patient including CMP, lipids, and CBC.  Exercise goals: All adults should avoid inactivity. Some physical activity is better than none, and adults who participate in any amount of physical activity gain some health benefits.    Behavioral modification strategies: increasing lean protein intake, decreasing simple carbohydrates, increasing vegetables, increasing water intake, decreasing eating out, no skipping meals, meal planning and cooking strategies, keeping healthy foods in the home and planning for success.  He was informed of the importance of frequent follow-up visits to maximize his success with intensive lifestyle modifications for his multiple health conditions. He was informed we would discuss his lab results at his next visit unless there is a critical issue that needs to be addressed sooner. Eliott agreed to keep his next visit at the agreed upon time to discuss these results.  Objective:   Blood pressure 128/86, pulse 92, temperature 98.4 F (36.9 C), height 6' (1.829 m), weight 295 lb (133.8 kg), SpO2 99 %. Body mass index is 40.01 kg/m.  EKG: Normal sinus rhythm, rate 94. Within normal limits.   Indirect Calorimeter completed today shows a VO2 of 278 and a REE of 1935.  His calculated basal metabolic rate is 2979 thus his basal metabolic rate is worse than expected.  General: Cooperative, alert, well developed, in no acute distress. HEENT: Conjunctivae and lids unremarkable. Cardiovascular: Regular rhythm.  Lungs: Normal work of breathing. Neurologic: No focal deficits.   Lab Results  Component Value  Date   CREATININE 1.26 01/06/2019   BUN 12 01/06/2019   NA 138 01/06/2019   K 4.7 01/06/2019   CL 102 01/06/2019   CO2 29 01/06/2019   Lab Results  Component Value Date   ALT 55 (H) 01/06/2019   AST 39 (H) 01/06/2019   ALKPHOS 68 01/06/2019   BILITOT 0.5 01/06/2019   Lab Results  Component Value Date   HGBA1C 6.5 01/06/2019   HGBA1C 5.8 04/15/2016   HGBA1C 6.0 04/11/2015   No results found for: INSULIN Lab Results  Component Value Date   TSH 3.81 04/26/2019   Lab Results  Component Value Date   CHOL 147 01/06/2019   HDL 36.80 (L) 01/06/2019   LDLCALC 82 01/06/2019    TRIG 144.0 01/06/2019   CHOLHDL 4 01/06/2019   Lab Results  Component Value Date   WBC 4.2 01/06/2019   HGB 13.9 01/06/2019   HCT 42.5 01/06/2019   MCV 80.4 01/06/2019   PLT 232.0 01/06/2019    Attestation Statements:   Reviewed by clinician on day of visit: allergies, medications, problem list, medical history, surgical history, family history, social history, and previous encounter notes.  Migdalia Dk, am acting as Location manager for [employer], DO   I have reviewed the above documentation for accuracy and completeness, and I agree with the above. Jearld Lesch, DO

## 2019-05-03 ENCOUNTER — Encounter (INDEPENDENT_AMBULATORY_CARE_PROVIDER_SITE_OTHER): Payer: Self-pay | Admitting: Bariatrics

## 2019-05-03 DIAGNOSIS — E119 Type 2 diabetes mellitus without complications: Secondary | ICD-10-CM | POA: Insufficient documentation

## 2019-05-03 LAB — COMPREHENSIVE METABOLIC PANEL
ALT: 38 IU/L (ref 0–44)
AST: 29 IU/L (ref 0–40)
Albumin/Globulin Ratio: 1.3 (ref 1.2–2.2)
Albumin: 4.4 g/dL (ref 4.0–5.0)
Alkaline Phosphatase: 86 IU/L (ref 39–117)
BUN/Creatinine Ratio: 8 — ABNORMAL LOW (ref 9–20)
BUN: 10 mg/dL (ref 6–24)
Bilirubin Total: 0.5 mg/dL (ref 0.0–1.2)
CO2: 25 mmol/L (ref 20–29)
Calcium: 9.1 mg/dL (ref 8.7–10.2)
Chloride: 100 mmol/L (ref 96–106)
Creatinine, Ser: 1.3 mg/dL — ABNORMAL HIGH (ref 0.76–1.27)
GFR calc Af Amer: 76 mL/min/{1.73_m2} (ref >59)
GFR calc non Af Amer: 65 mL/min/{1.73_m2} (ref >59)
Globulin, Total: 3.3 g/dL (ref 1.5–4.5)
Glucose: 96 mg/dL (ref 65–99)
Potassium: 4 mmol/L (ref 3.5–5.2)
Sodium: 141 mmol/L (ref 134–144)
Total Protein: 7.7 g/dL (ref 6.0–8.5)

## 2019-05-03 LAB — HEMOGLOBIN A1C
Est. average glucose Bld gHb Est-mCnc: 140 mg/dL
Hgb A1c MFr Bld: 6.5 % — ABNORMAL HIGH (ref 4.8–5.6)

## 2019-05-03 LAB — VITAMIN D 25 HYDROXY (VIT D DEFICIENCY, FRACTURES): Vit D, 25-Hydroxy: 51.2 ng/mL (ref 30.0–100.0)

## 2019-05-03 LAB — INSULIN, RANDOM: INSULIN: 53.3 u[IU]/mL — ABNORMAL HIGH (ref 2.6–24.9)

## 2019-05-16 ENCOUNTER — Ambulatory Visit (INDEPENDENT_AMBULATORY_CARE_PROVIDER_SITE_OTHER): Payer: Self-pay | Admitting: Bariatrics

## 2019-05-16 ENCOUNTER — Encounter (INDEPENDENT_AMBULATORY_CARE_PROVIDER_SITE_OTHER): Payer: Self-pay | Admitting: Bariatrics

## 2019-05-16 ENCOUNTER — Other Ambulatory Visit: Payer: Self-pay

## 2019-05-16 VITALS — BP 135/84 | HR 98 | Temp 98.4°F | Ht 72.0 in | Wt 296.0 lb

## 2019-05-16 DIAGNOSIS — E119 Type 2 diabetes mellitus without complications: Secondary | ICD-10-CM

## 2019-05-16 DIAGNOSIS — F3289 Other specified depressive episodes: Secondary | ICD-10-CM

## 2019-05-16 DIAGNOSIS — Z9189 Other specified personal risk factors, not elsewhere classified: Secondary | ICD-10-CM

## 2019-05-16 DIAGNOSIS — Z6841 Body Mass Index (BMI) 40.0 and over, adult: Secondary | ICD-10-CM

## 2019-05-17 ENCOUNTER — Encounter (INDEPENDENT_AMBULATORY_CARE_PROVIDER_SITE_OTHER): Payer: Self-pay | Admitting: Bariatrics

## 2019-05-17 NOTE — Progress Notes (Signed)
Chief Complaint:   OBESITY Shawn Becker is here to discuss his progress with his obesity treatment plan along with follow-up of his obesity related diagnoses. Shawn Becker is on the Category 3 Plan and states he is following his eating plan approximately 50% of the time. Shawn Becker states he is exercising 0 minutes 0 times per week.  Today's visit was #: 2 Starting weight: 295 lbs Starting date: 05/02/2019 Today's weight: 296 lbs Today's date: 05/16/2019 Total lbs lost to date: 0 Total lbs lost since last in-office visit: 0  Interim History: Shawn Becker is up 1 lb. He states that he did well the first week, but then had Valentine's Day and then increased stress and could not get back on track. He has cravings for sweets.  Subjective:   Type 2 diabetes mellitus without complication, without long-term current use of insulin (HCC). Shawn Becker is taking metformin.  Lab Results  Component Value Date   HGBA1C 6.5 (H) 05/02/2019   HGBA1C 6.5 01/06/2019   HGBA1C 5.8 04/15/2016   Lab Results  Component Value Date   LDLCALC 82 01/06/2019   CREATININE 1.30 (H) 05/02/2019   Lab Results  Component Value Date   INSULIN 53.3 (H) 05/02/2019   Other depression,with emotional eating. Shawn Becker is struggling with emotional eating and using food for comfort to the extent that it is negatively impacting his health. He has been working on behavior modification techniques to help reduce his emotional eating and has been somewhat successful. He shows no sign of suicidal or homicidal ideations. Shawn Becker reports nighttime cravings. No history of seizures, glaucoma, or renal calculi.  At risk for hyperglycemia. Shawn Becker is at risk for hyperglycemia due to diabetes mellitus.  Assessment/Plan:   Type 2 diabetes mellitus without complication, without long-term current use of insulin (HCC). Good blood sugar control is important to decrease the likelihood of diabetic complications  such as nephropathy, neuropathy, limb loss, blindness, coronary artery disease, and death. Intensive lifestyle modification including diet, exercise and weight loss are the first line of treatment for diabetes. Shawn Becker will continue metformin as directed.  Other depression,with emotional eating. Behavior modification techniques were discussed today to help Leavy deal with his emotional/non-hunger eating behaviors.  Orders and follow up as documented in patient record. Shawn Becker was given a prescription for Wellbutrin 150 mg 1 PO daily #30 with 0 refills.  At risk for hyperglycemia. Shawn Becker was given approximately 15 minutes of counseling today regarding prevention of hyperglycemia. He was advised of hyperglycemia causes and the fact hyperglycemia is often asymptomatic. Shawn Becker was instructed to avoid skipping meals, eat regular protein rich meals and schedule low calorie but protein rich snacks as needed.   Repetitive spaced learning was employed today to elicit superior memory formation and behavioral change.  Class 3 severe obesity with serious comorbidity and body mass index (BMI) of 40.0 to 44.9 in adult, unspecified obesity type (HCC)  Shawn Becker is currently in the action stage of change. As such, his goal is to continue with weight loss efforts. He has agreed to the Category 3 Plan.   He will work on meal planning, intentional eating, decreasing carbohydrates, and increasing protein at dinner.  We independently reviewed lab results with the patient including Vitamin D, A1c, insulin, and CMP.  Exercise goals: All adults should avoid inactivity. Some physical activity is better than none, and adults who participate in any amount of physical activity gain some health benefits.  Behavioral modification strategies: increasing lean protein intake, decreasing simple carbohydrates, increasing vegetables,  increasing water intake, decreasing eating out, no skipping meals, meal  planning and cooking strategies, keeping healthy foods in the home, better snacking choices, emotional eating strategies and planning for success.  Shawn Becker has agreed to follow-up with our clinic in 2 weeks. He was informed of the importance of frequent follow-up visits to maximize his success with intensive lifestyle modifications for his multiple health conditions.   Objective:   Blood pressure 135/84, pulse 98, temperature 98.4 F (36.9 C), temperature source Oral, height 6' (1.829 m), weight 296 lb (134.3 kg), SpO2 99 %. Body mass index is 40.14 kg/m.  General: Cooperative, alert, well developed, in no acute distress. HEENT: Conjunctivae and lids unremarkable. Cardiovascular: Regular rhythm.  Lungs: Normal work of breathing. Neurologic: No focal deficits.   Lab Results  Component Value Date   CREATININE 1.30 (H) 05/02/2019   BUN 10 05/02/2019   NA 141 05/02/2019   K 4.0 05/02/2019   CL 100 05/02/2019   CO2 25 05/02/2019   Lab Results  Component Value Date   ALT 38 05/02/2019   AST 29 05/02/2019   ALKPHOS 86 05/02/2019   BILITOT 0.5 05/02/2019   Lab Results  Component Value Date   HGBA1C 6.5 (H) 05/02/2019   HGBA1C 6.5 01/06/2019   HGBA1C 5.8 04/15/2016   HGBA1C 6.0 04/11/2015   Lab Results  Component Value Date   INSULIN 53.3 (H) 05/02/2019   Lab Results  Component Value Date   TSH 3.81 04/26/2019   Lab Results  Component Value Date   CHOL 147 01/06/2019   HDL 36.80 (L) 01/06/2019   LDLCALC 82 01/06/2019   TRIG 144.0 01/06/2019   CHOLHDL 4 01/06/2019   Lab Results  Component Value Date   WBC 4.2 01/06/2019   HGB 13.9 01/06/2019   HCT 42.5 01/06/2019   MCV 80.4 01/06/2019   PLT 232.0 01/06/2019   No results found for: IRON, TIBC, FERRITIN  Attestation Statements:   Reviewed by clinician on day of visit: allergies, medications, problem list, medical history, surgical history, family history, social history, and previous encounter notes.  Migdalia Dk, am acting as Location manager for [employer], DO   I have reviewed the above documentation for accuracy and completeness, and I agree with the above. Jearld Lesch, DO

## 2019-05-18 ENCOUNTER — Encounter (INDEPENDENT_AMBULATORY_CARE_PROVIDER_SITE_OTHER): Payer: Self-pay | Admitting: Bariatrics

## 2019-05-18 ENCOUNTER — Other Ambulatory Visit (INDEPENDENT_AMBULATORY_CARE_PROVIDER_SITE_OTHER): Payer: Self-pay

## 2019-05-18 DIAGNOSIS — F3289 Other specified depressive episodes: Secondary | ICD-10-CM

## 2019-05-18 MED ORDER — BUPROPION HCL ER (SR) 150 MG PO TB12: 150.0000 mg | ORAL_TABLET | Freq: Every day | ORAL | 0 refills | Status: DC

## 2019-05-31 ENCOUNTER — Ambulatory Visit (INDEPENDENT_AMBULATORY_CARE_PROVIDER_SITE_OTHER): Payer: Self-pay | Admitting: Physician Assistant

## 2019-06-23 ENCOUNTER — Ambulatory Visit
Admission: EM | Admit: 2019-06-23 | Discharge: 2019-06-23 | Disposition: A | Discharge status: Home / Self Care | Payer: BC Managed Care – PPO | Attending: Emergency Medicine | Admitting: Emergency Medicine

## 2019-06-23 DIAGNOSIS — M7661 Achilles tendinitis, right leg: Secondary | ICD-10-CM

## 2019-06-23 MED ORDER — NAPROXEN 500 MG PO TABS: 500.0000 mg | ORAL_TABLET | Freq: Two times a day (BID) | ORAL | 0 refills | Status: DC

## 2019-06-23 NOTE — Discharge Instructions (Addendum)
Recommend RICE: rest, ice, compression, elevation as needed for pain.    Heat therapy (hot compress, warm wash red, hot showers, etc.) can help relax muscles and soothe muscle aches. Cold therapy (ice packs) can be used to help swelling both after injury and after prolonged use of areas of chronic pain/aches.  For pain: naproxen, may take tylenol needed.

## 2019-06-23 NOTE — ED Provider Notes (Signed)
[name]    CSN: [id number] Arrival date & time: 06/23/19  0906      History   Chief Complaint Chief Complaint  Patient presents with  . Foot Pain    HPI Shawn Becker is a 49 y.o. male with history of hypertension, hypothyroidism, prediabetes presenting for right heel pain since Monday evening.  Patient states he went to get his dogs in the middle the night and had severe pain with weightbearing.  Patient states only thing he can think of that could have caused this is moving walking on Sunday.  Otherwise no change in activity level, time spent on feet, medications.  Patient did get first Covid vaccine last Thursday: Tolerated this well.  Patient applied ice the first night which helped some, though has not been applying regularly since.  Patient took 2 tabs of ibuprofen 3 times daily with minimal relief.  Patient describes pain as tight, sometimes pulsating.  Denies numbness, discoloration, injury.    Past Medical History:  Diagnosis Date  . Allergy   . Fatty liver   . Hypertension   . Hypothyroidism   . Left ventricular hypertrophy   . Pre-diabetes     Patient Active Problem List   Diagnosis Date Noted  . Controlled type 2 diabetes mellitus without complication, without long-term current use of insulin (HCC) 05/03/2019  . Class 3 severe obesity due to excess calories with serious comorbidity and body mass index (BMI) of 40.0 to 44.9 in adult (HCC) 05/02/2019  . Left ventricular hypertrophy 04/14/2017  . Hashimoto's thyroiditis 04/15/2016  . Essential hypertension 04/15/2016    History reviewed. No pertinent surgical history.     Home Medications    Prior to Admission medications   Medication Sig Start Date End Date Taking? Authorizing Provider  buPROPion (WELLBUTRIN SR) 150 MG 12 hr tablet Take 1 tablet (150 mg total) by mouth daily. 05/18/19  Yes Corinna Capra A, DO  Cholecalciferol (VITAMIN D-3) 125 MCG (5000 UT) TABS Take by mouth.     [provider]  levothyroxine (SYNTHROID) 50 MCG tablet Take 1 tablet (50 mcg total) by mouth daily before breakfast. 01/06/19 05/02/19  Nafziger, Kandee Keen, NP  losartan-hydrochlorothiazide (HYZAAR) 100-12.5 MG tablet Take 1 tablet by mouth daily. 01/05/19   Nafziger, Kandee Keen, NP  metFORMIN (GLUCOPHAGE) 500 MG tablet Take 0.5 tablets (250 mg total) by mouth daily with breakfast. 01/06/19 05/02/19  Nafziger, Kandee Keen, NP  naproxen (NAPROSYN) 500 MG tablet Take 1 tablet (500 mg total) by mouth 2 (two) times daily. 06/23/19   Hall-Potvin, [place], PA-C  omega-3 acid ethyl esters (LOVAZA) 1 g capsule Take by mouth 2 (two) times daily.    [provider]    Family History Family History  Problem Relation Age of Onset  . Diabetes Mother   . Hypertension Mother   . Mental illness Mother   . Bipolar disorder Mother   . Schizophrenia Mother   . Obesity Mother   . Diabetes Maternal Grandmother   . Hypertension Maternal Grandmother   . Cancer Maternal Grandfather        colon cancer  . Hyperlipidemia Paternal Grandmother   . Cancer Paternal Grandfather        prostate cancer  . Alcoholism Father   . Drug abuse Father     Social History Social History   Tobacco Use  . Smoking status: Never Smoker  . Smokeless tobacco: Never Used  Substance Use Topics  . Alcohol use: No    Alcohol/week: 0.0  standard drinks  . Drug use: No     Allergies   Penicillins and Shellfish allergy   Review of Systems As per HPI   Physical Exam Triage Vital Signs ED Triage Vitals  Enc Vitals Group     BP      Pulse      Resp      Temp      Temp src      SpO2      Weight      Height      Head Circumference      Peak Flow      Pain Score      Pain Loc      Pain Edu?      Excl. in [place]?    No data found.  Updated Vital Signs BP (!) 143/89   Pulse 95   Temp 98 F (36.7 C) (Oral)   Resp 16   SpO2 97%   Visual Acuity Right Eye Distance:   Left Eye Distance:   Bilateral Distance:      Right Eye Near:   Left Eye Near:    Bilateral Near:     Physical Exam Constitutional:      General: He is not in acute distress. HENT:     Head: Normocephalic and atraumatic.  Eyes:     General: No scleral icterus.    Pupils: Pupils are equal, round, and reactive to light.  Cardiovascular:     Rate and Rhythm: Normal rate.  Pulmonary:     Effort: Pulmonary effort is normal. No respiratory distress.     Breath sounds: No wheezing.  Musculoskeletal:        General: Swelling and tenderness present. Normal range of motion.     Comments: Mild swelling in right Achilles insertion at heel as compared to left.  Exquisite TTP without crepitus, deformity.  Negative Thompson test.  NVI  Skin:    Coloration: Skin is not jaundiced or pale.  Neurological:     Mental Status: He is alert and oriented to person, place, and time.      UC Treatments / Results  Labs (all labs ordered are listed, but only abnormal results are displayed) Labs Reviewed - No data to display  EKG   Radiology No results found.  Procedures Procedures (including critical care time)  Medications Ordered in UC Medications - No data to display  Initial Impression / Assessment and Plan / UC Course  I have reviewed the triage vital signs and the nursing notes.  Pertinent labs & imaging results that were available during my care of the patient were reviewed by me and considered in my medical decision making (see chart for details).     Patient with significant TTP and mild edema over heel insertion of Achilles.  No signs of rupture.  Will immobilize, ice, compress, follow-up with Ortho for persistent or worsening symptoms.  CAM Walker applied in office which patient tolerated well.  Return precautions discussed, patient verbalized understanding and is agreeable to plan. Final Clinical Impressions(s) / UC Diagnoses   Final diagnoses:  Achilles tendinitis of right lower extremity     Discharge  Instructions     Recommend RICE: rest, ice, compression, elevation as needed for pain.    Heat therapy (hot compress, warm wash red, hot showers, etc.) can help relax muscles and soothe muscle aches. Cold therapy (ice packs) can be used to help swelling both after injury and after prolonged use of areas of  chronic pain/aches.  For pain: naproxen, may take tylenol needed.    ED Prescriptions    Medication Sig Dispense Auth. Provider   naproxen (NAPROSYN) 500 MG tablet Take 1 tablet (500 mg total) by mouth 2 (two) times daily. 30 tablet Hall-Potvin, [place], PA-C     PDMP not reviewed this encounter.   Hall-Potvin, [place], [place] 06/23/19 1019

## 2019-06-23 NOTE — ED Triage Notes (Signed)
Pt c/o pain to rt heel of foot since Monday night. Denies injury. 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 States pain when walking.

## 2019-08-16 ENCOUNTER — Other Ambulatory Visit: Payer: Self-pay

## 2019-08-17 ENCOUNTER — Ambulatory Visit (INDEPENDENT_AMBULATORY_CARE_PROVIDER_SITE_OTHER): Payer: BC Managed Care – PPO | Admitting: Adult Health

## 2019-08-17 ENCOUNTER — Encounter: Payer: Self-pay | Admitting: Adult Health

## 2019-08-17 VITALS — BP 130/82 | Temp 97.7°F | Wt 303.0 lb

## 2019-08-17 DIAGNOSIS — Z6841 Body Mass Index (BMI) 40.0 and over, adult: Secondary | ICD-10-CM | POA: Diagnosis not present

## 2019-08-17 DIAGNOSIS — N179 Acute kidney failure, unspecified: Secondary | ICD-10-CM | POA: Diagnosis not present

## 2019-08-17 LAB — BASIC METABOLIC PANEL
BUN: 13 mg/dL (ref 6–23)
CO2: 31 mEq/L (ref 19–32)
Calcium: 9.1 mg/dL (ref 8.4–10.5)
Chloride: 102 mEq/L (ref 96–112)
Creatinine, Ser: 1.32 mg/dL (ref 0.40–1.50)
GFR: 70.52 mL/min (ref >60.00)
Glucose, Bld: 117 mg/dL — ABNORMAL HIGH (ref 70–99)
Potassium: 4 mEq/L (ref 3.5–5.1)
Sodium: 138 mEq/L (ref 135–145)

## 2019-08-17 MED ORDER — PHENTERMINE HCL 15 MG PO CAPS: 15.0000 mg | ORAL_CAPSULE | ORAL | 0 refills | Status: DC

## 2019-08-17 NOTE — Patient Instructions (Signed)
I am going to send in Phentermine for you.   Please follow up in one month   Monitor BP at home

## 2019-08-17 NOTE — Progress Notes (Signed)
Subjective:    Patient ID: Shawn Becker, male    DOB: [birth year], 49 y.o.   MRN: [id number]  HPI 49 year old male who  has a past medical history of Allergy, Fatty liver, Hypertension, Hypothyroidism, Left ventricular hypertrophy, and Pre-diabetes.  He was seen at [facility] emergency room in [place] on 07/22/2019 for diarrhea.  At this time he had diarrhea for 4 days prior.  He denied blood in his stool or abnormal color.  He had no recent travel, antibiotics, sick contacts or suspect food intake.  He did report a bloating sensation but denied abdominal pain, fevers, nausea, or vomiting.  His labs show a creatinine of 2.42 where his baseline is around 1.3.  In the ER he was given 2 L normal saline with a repeat creatinine of 2.2.  Did provide a stool sample which came back negative.  He is here to follow-up for acute kidney injury.  He reports today that he has not had any more bloating or diarrhea and that his stools are back to normal.  Additionally, he is actively trying to lose weight through diet and exercise.  He was going to healthy weight wellness clinic but due to high deductible insurance plan he cannot afford to go there.  In the past through a separate weight loss clinic he was prescribed phentermine and did a low-carb diet and was able to lose about 60 pounds.  He is interested in going back to do this but wanted my advice.  Wt Readings from Last 3 Encounters:  08/17/19 (!) 303 lb (137.4 kg)  05/16/19 296 lb (134.3 kg)  05/02/19 295 lb (133.8 kg)      Review of Systems See HPI   Past Medical History:  Diagnosis Date  . Allergy   . Fatty liver   . Hypertension   . Hypothyroidism   . Left ventricular hypertrophy   . Pre-diabetes     Social History   Socioeconomic History  . Marital status: Married    Spouse name: Vevelyn Francois  . Number of children: Not on file  . Years of education: Not on file  . Highest education level: Not on file  Occupational  History  . 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 Smoking status: Never Smoker  . Smokeless tobacco: Never Used  Substance and Sexual Activity  . Alcohol use: No    Alcohol/week: 0.0 standard drinks  . Drug use: No  . Sexual activity: Not on file  Other Topics Concern  . Not on file  Social History Narrative   Renato Gails    Married    4 children          He likes to ride his motorcycle.       Social Determinants of Health   Financial Resource Strain:   . Difficulty of Paying Living Expenses:   Food Insecurity:   . Worried About Programme researcher, broadcasting/film/video in the Last Year:   . Barista in the Last Year:   Transportation Needs:   . Freight forwarder (Medical):   Marland Kitchen Lack of Transportation (Non-Medical):   Physical Activity:   . Days of Exercise per Week:   . Minutes of Exercise per Session:   Stress:   . Feeling of Stress :   Social Connections:   . Frequency of Communication with Friends and Family:   . Frequency of Social Gatherings with Friends and Family:   . Attends Religious Services:   . Active  Member of Clubs or Organizations:   . Attends Banker Meetings:   Marland Kitchen Marital Status:   Intimate Partner Violence:   . Fear of Current or Ex-Partner:   . Emotionally Abused:   Marland Kitchen Physically Abused:   . Sexually Abused:     History reviewed. No pertinent surgical history.  Family History  Problem Relation Age of Onset  . Diabetes Mother   . Hypertension Mother   . Mental illness Mother   . Bipolar disorder Mother   . Schizophrenia Mother   . Obesity Mother   . Diabetes Maternal Grandmother   . Hypertension Maternal Grandmother   . Cancer Maternal Grandfather        colon cancer  . Hyperlipidemia Paternal Grandmother   . Cancer Paternal Grandfather        prostate cancer  . Alcoholism Father   . Drug abuse Father     Allergies  Allergen Reactions  . Penicillins   . Shellfish Allergy     Current Outpatient Medications on File  Prior to Visit  Medication Sig Dispense Refill  . Cholecalciferol (VITAMIN D-3) 125 MCG (5000 UT) TABS Take by mouth.    . losartan-hydrochlorothiazide (HYZAAR) 100-12.5 MG tablet Take 1 tablet by mouth daily. 90 tablet 3  . omega-3 acid ethyl esters (LOVAZA) 1 g capsule Take by mouth 2 (two) times daily.    Marland Kitchen levothyroxine (SYNTHROID) 50 MCG tablet Take 1 tablet (50 mcg total) by mouth daily before breakfast. 90 tablet 3  . metFORMIN (GLUCOPHAGE) 500 MG tablet Take 0.5 tablets (250 mg total) by mouth daily with breakfast. 45 tablet 1   No current facility-administered medications on file prior to visit.    BP 130/82   Temp 97.7 F (36.5 C)   Wt (!) 303 lb (137.4 kg)   BMI 41.09 kg/m       Objective:   Physical Exam Vitals and nursing note reviewed.  Constitutional:      Appearance: Normal appearance. He is obese.  Cardiovascular:     Rate and Rhythm: Normal rate and regular rhythm.     Pulses: Normal pulses.     Heart sounds: Normal heart sounds.  Pulmonary:     Effort: Pulmonary effort is normal.     Breath sounds: Normal breath sounds.  Skin:    General: Skin is warm and dry.     Capillary Refill: Capillary refill takes less than 2 seconds.  Neurological:     General: No focal deficit present.     Mental Status: He is alert and oriented to person, place, and time.  Psychiatric:        Mood and Affect: Mood normal.        Behavior: Behavior normal.        Thought Content: Thought content normal.        Judgment: Judgment normal.        Assessment & Plan:  1. Acute kidney injury (HCC)  - Basic Metabolic Panel  2. Class 3 severe obesity due to excess calories with serious comorbidity and body mass index (BMI) of 40.0 to 44.9 in adult [facility]) -I am willing to place him back on phentermine 15 mg as he is actively trying to lose weight.  He was advised to continue with low carb diet and frequent aerobic exercise.  He will monitor his blood pressures at home and if they  elevate then he will stop the medication.  Advise follow-up in 1 month - phentermine 15 MG  capsule; Take 1 capsule (15 mg total) by mouth every morning.  Dispense: 30 capsule; Refill: 0   Dorothyann Peng, NP

## 2019-09-16 ENCOUNTER — Telehealth: Payer: Self-pay | Admitting: Adult Health

## 2019-09-16 NOTE — Telephone Encounter (Signed)
Pt dropped off a Physician's Release Form that needs to be completed. Informed pt of the 3-5 business day and possible fee.  Placed in red folder.   When completed, pt would like a call at [phone] to pick it up.

## 2019-09-20 ENCOUNTER — Ambulatory Visit: Payer: BC Managed Care – PPO | Admitting: Adult Health

## 2019-09-20 NOTE — Telephone Encounter (Signed)
LVM to schedule appt

## 2019-09-22 ENCOUNTER — Telehealth: Payer: Self-pay | Admitting: Adult Health

## 2019-09-22 NOTE — Telephone Encounter (Signed)
error 

## 2019-11-10 ENCOUNTER — Other Ambulatory Visit: Payer: Self-pay | Admitting: Adult Health

## 2019-11-10 NOTE — Telephone Encounter (Signed)
Patient need to schedule an ov for more refills. 

## 2020-01-06 ENCOUNTER — Other Ambulatory Visit: Payer: Self-pay | Admitting: Adult Health

## 2020-01-06 DIAGNOSIS — E063 Autoimmune thyroiditis: Secondary | ICD-10-CM

## 2020-01-06 DIAGNOSIS — I517 Cardiomegaly: Secondary | ICD-10-CM

## 2020-01-06 DIAGNOSIS — I10 Essential (primary) hypertension: Secondary | ICD-10-CM

## 2020-01-06 DIAGNOSIS — Z0000 Encounter for general adult medical examination without abnormal findings: Secondary | ICD-10-CM

## 2020-02-10 ENCOUNTER — Encounter: Payer: Self-pay | Admitting: Adult Health

## 2020-02-10 ENCOUNTER — Ambulatory Visit (INDEPENDENT_AMBULATORY_CARE_PROVIDER_SITE_OTHER): Payer: BC Managed Care – PPO | Admitting: Adult Health

## 2020-02-10 ENCOUNTER — Other Ambulatory Visit: Payer: Self-pay

## 2020-02-10 VITALS — BP 146/98 | HR 100 | Temp 98.1°F | Ht 72.0 in | Wt 303.2 lb

## 2020-02-10 DIAGNOSIS — N5089 Other specified disorders of the male genital organs: Secondary | ICD-10-CM

## 2020-02-10 NOTE — Progress Notes (Signed)
Subjective:    Patient ID: Shawn Becker, male    DOB: 12/20/1970, 49 y.o.   MRN: [id number]  HPI 49 year old male who  has a past medical history of Allergy, Fatty liver, Hypertension, Hypothyroidism, Left ventricular hypertrophy, and Pre-diabetes.  He presents to the office today for an acute issue of a small " lump" on the right testicle. He first noticed this about two weeks ago. Does report that that he is noticed "dull annoyance" in his right testicle when he lays down.  Denies any discomfort with standing or activity.  Denies trauma or aggravating injury.   Review of Systems See HPI   Past Medical History:  Diagnosis Date   Allergy    Fatty liver    Hypertension    Hypothyroidism    Left ventricular hypertrophy    Pre-diabetes     Social History   Socioeconomic History   Marital status: Married    Spouse name: Taneka   Number of children: Not on file   Years of education: Not on file   Highest education level: Not on file  Occupational History   Occupation: Pastor/Musician/Studeny  Tobacco Use   Smoking status: Never Smoker   Smokeless tobacco: Never Used  Building services engineer Use: Never used  Substance and Sexual Activity   Alcohol use: No    Alcohol/week: 0.0 standard drinks   Drug use: No   Sexual activity: Not on file  Other Topics Concern   Not on file  Social History Narrative   Renato Gails    Married    4 children          He likes to ride his motorcycle.       Social Determinants of Health   Financial Resource Strain:    Difficulty of Paying Living Expenses: Not on file  Food Insecurity:    Worried About Programme researcher, broadcasting/film/video in the Last Year: Not on file   [employer] of Food in the Last Year: Not on file  Transportation Needs:    Lack of Transportation (Medical): Not on file   Lack of Transportation (Non-Medical): Not on file  Physical Activity:    Days of Exercise per Week: Not on file   Minutes of Exercise  per Session: Not on file  Stress:    Feeling of Stress : Not on file  Social Connections:    Frequency of Communication with Friends and Family: Not on file   Frequency of Social Gatherings with Friends and Family: Not on file   Attends Religious Services: Not on file   Active Member of Clubs or Organizations: Not on file   Attends Banker Meetings: Not on file   Marital Status: Not on file  Intimate Partner Violence:    Fear of Current or Ex-Partner: Not on file   Emotionally Abused: Not on file   Physically Abused: Not on file   Sexually Abused: Not on file    No past surgical history on file.  Family History  Problem Relation Age of Onset   Diabetes Mother    Hypertension Mother    Mental illness Mother    Bipolar disorder Mother    Schizophrenia Mother    Obesity Mother    Diabetes Maternal Grandmother    Hypertension Maternal Grandmother    Cancer Maternal Grandfather        colon cancer   Hyperlipidemia Paternal Grandmother    Cancer Paternal Grandfather  prostate cancer   Alcoholism Father    Drug abuse Father     Allergies  Allergen Reactions   Penicillins    Shellfish Allergy     Current Outpatient Medications on File Prior to Visit  Medication Sig Dispense Refill   Cholecalciferol (VITAMIN D-3) 125 MCG (5000 UT) TABS Take by mouth.     EUTHYROX 50 MCG tablet TAKE 1 TABLET BY MOUTH ONCE DAILY BEFORE BREAKFAST 90 tablet 0   losartan-hydrochlorothiazide (HYZAAR) 100-12.5 MG tablet Take 1 tablet by mouth once daily 90 tablet 0   metFORMIN (GLUCOPHAGE) 500 MG tablet TAKE 1/2 (ONE-HALF) TABLET BY MOUTH ONCE DAILY WITH BREAKFAST 45 tablet 0   omega-3 acid ethyl esters (LOVAZA) 1 g capsule Take by mouth 2 (two) times daily.     phentermine 15 MG capsule Take 1 capsule (15 mg total) by mouth every morning. 30 capsule 0   No current facility-administered medications on file prior to visit.    BP (!) 146/98     Pulse 100    Temp 98.1 F (36.7 C) (Oral)    Ht 6' (1.829 m)    Wt (!) 303 lb 3.2 oz (137.5 kg)    SpO2 97%    BMI 41.12 kg/m       Objective:   Physical Exam Vitals and nursing note reviewed.  Constitutional:      Appearance: Normal appearance. He is obese.  Abdominal:     Hernia: There is no hernia in the right inguinal area.  Genitourinary:    Penis: Normal and circumcised. No discharge or lesions.      Testes:        Right: Mass (pea sized mass on vas deferens) present. Tenderness or swelling not present.     Epididymis:     Right: Normal.  Skin:    General: Skin is warm and dry.     Capillary Refill: Capillary refill takes less than 2 seconds.  Neurological:     General: No focal deficit present.     Mental Status: He is alert and oriented to person, place, and time.  Psychiatric:        Mood and Affect: Mood normal.        Behavior: Behavior normal.        Thought Content: Thought content normal.        Judgment: Judgment normal.       Assessment & Plan:  1. Scrotal mass - No torsion noted - US Scrotum; Future  Shirline Frees, NP

## 2020-03-01 ENCOUNTER — Ambulatory Visit
Admission: RE | Admit: 2020-03-01 | Discharge: 2020-03-01 | Disposition: A | Discharge status: Home / Self Care | Payer: BC Managed Care – PPO | Source: Ambulatory Visit | Attending: Adult Health | Admitting: Adult Health

## 2020-03-01 DIAGNOSIS — N5089 Other specified disorders of the male genital organs: Secondary | ICD-10-CM

## 2020-03-02 ENCOUNTER — Telehealth: Payer: Self-pay | Admitting: Adult Health

## 2020-03-02 DIAGNOSIS — I861 Scrotal varices: Secondary | ICD-10-CM

## 2020-03-02 NOTE — Telephone Encounter (Signed)
Updated patient on his scrotal US   IMPRESSION: RIGHT varicocele is present; unilateral RIGHT varicocele is atypical and often secondary to an intra-abdominal process, and further evaluation by CT imaging of the abdomen and pelvis with IV and oral contrast is recommended.  He is ok with getting CT scan

## 2020-03-05 ENCOUNTER — Other Ambulatory Visit (INDEPENDENT_AMBULATORY_CARE_PROVIDER_SITE_OTHER): Payer: BC Managed Care – PPO

## 2020-03-05 ENCOUNTER — Other Ambulatory Visit: Payer: Self-pay

## 2020-03-05 DIAGNOSIS — I861 Scrotal varices: Secondary | ICD-10-CM

## 2020-03-05 NOTE — Addendum Note (Signed)
Addended by: Lerry Liner on: 03/05/2020 08:23 AM   Modules accepted: Orders

## 2020-03-06 LAB — BASIC METABOLIC PANEL
BUN: 12 mg/dL (ref 7–25)
CO2: 30 mmol/L (ref 20–32)
Calcium: 9.4 mg/dL (ref 8.6–10.3)
Chloride: 102 mmol/L (ref 98–110)
Creat: 1.34 mg/dL (ref 0.60–1.35)
Glucose, Bld: 125 mg/dL — ABNORMAL HIGH (ref 65–99)
Potassium: 4.3 mmol/L (ref 3.5–5.3)
Sodium: 139 mmol/L (ref 135–146)

## 2020-03-08 ENCOUNTER — Other Ambulatory Visit: Payer: Self-pay | Admitting: Adult Health

## 2020-03-20 ENCOUNTER — Ambulatory Visit (INDEPENDENT_AMBULATORY_CARE_PROVIDER_SITE_OTHER): Payer: BC Managed Care – PPO

## 2020-03-20 ENCOUNTER — Other Ambulatory Visit: Payer: Self-pay

## 2020-03-20 ENCOUNTER — Ambulatory Visit (INDEPENDENT_AMBULATORY_CARE_PROVIDER_SITE_OTHER): Payer: BC Managed Care – PPO | Admitting: Adult Health

## 2020-03-20 ENCOUNTER — Encounter: Payer: Self-pay | Admitting: Adult Health

## 2020-03-20 VITALS — BP 132/82 | HR 105 | Temp 98.5°F | Ht 72.0 in | Wt 311.6 lb

## 2020-03-20 DIAGNOSIS — M2550 Pain in unspecified joint: Secondary | ICD-10-CM

## 2020-03-20 DIAGNOSIS — G5603 Carpal tunnel syndrome, bilateral upper limbs: Secondary | ICD-10-CM | POA: Diagnosis not present

## 2020-03-20 MED ORDER — PREDNISONE 10 MG PO TABS: ORAL_TABLET | ORAL | 0 refills | Status: DC

## 2020-03-20 NOTE — Progress Notes (Signed)
Subjective:    Patient ID: GAVON MAJANO, male    DOB: 1970/04/08, 49 y.o.   MRN: [id number]  HPI 49 year old male who  has a past medical history of Allergy, Fatty liver, Hypertension, Hypothyroidism, Left ventricular hypertrophy, and Pre-diabetes.   He presents to the office today for concern of multiple joint pain.  Most of his joint pain started roughly 3 months ago.  He reports joint pain in the outside of his left hip as well as bilateral groin, bilateral knee pain, and bilateral ankle pain.  His left hip pain is felt as "an ache", groin pain is more of a sore achy feeling, and his bilateral knee pain is described as more of a achy feeling.  He does endorse popping grinding sensation in both knees.  Additionally, he has pain located on the lateral aspect of his right lower leg that has been present for multiple months.  All of his pain comes and goes, sometimes it is worse when he walks and other times is worse when he is laying down.  He also reports bilateral hand and finger numbness and tingling to the second third and fourth digits of both hands that happens more at night.  At night he often wake up feeling as though his hands are asleep.  Denies any known injury or trauma.  Wt Readings from Last 3 Encounters:  03/20/20 (!) 311 lb 9.6 oz (141.3 kg)  02/10/20 (!) 303 lb 3.2 oz (137.5 kg)  08/17/19 (!) 303 lb (137.4 kg)   Review of Systems See HPI   Past Medical History:  Diagnosis Date  . Allergy   . Fatty liver   . Hypertension   . Hypothyroidism   . Left ventricular hypertrophy   . Pre-diabetes     Social History   Socioeconomic History  . Marital status: Married    Spouse name: Vevelyn Francois  . Number of children: Not on file  . Years of education: Not on file  . Highest education level: Not on file  Occupational History  . 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 Smoking status: Never Smoker  . Smokeless tobacco: Never Used  Vaping Use  .  Vaping Use: Never used  Substance and Sexual Activity  . Alcohol use: No    Alcohol/week: 0.0 standard drinks  . Drug use: No  . Sexual activity: Not on file  Other Topics Concern  . Not on file  Social History Narrative   Renato Gails    Married    4 children          He likes to ride his motorcycle.       Social Determinants of Health   Financial Resource Strain: Not on file  Food Insecurity: Not on file  Transportation Needs: Not on file  Physical Activity: Not on file  Stress: Not on file  Social Connections: Not on file  Intimate Partner Violence: Not on file    History reviewed. No pertinent surgical history.  Family History  Problem Relation Age of Onset  . Diabetes Mother   . Hypertension Mother   . Mental illness Mother   . Bipolar disorder Mother   . Schizophrenia Mother   . Obesity Mother   . Diabetes Maternal Grandmother   . Hypertension Maternal Grandmother   . Cancer Maternal Grandfather        colon cancer  . Hyperlipidemia Paternal Grandmother   . Cancer Paternal Grandfather        prostate cancer  .  Alcoholism Father   . Drug abuse Father     Allergies  Allergen Reactions  . Penicillins   . Shellfish Allergy     Current Outpatient Medications on File Prior to Visit  Medication Sig Dispense Refill  . Cholecalciferol (VITAMIN D-3) 125 MCG (5000 UT) TABS Take by mouth.    Janann August 50 MCG tablet TAKE 1 TABLET BY MOUTH ONCE DAILY BEFORE BREAKFAST 90 tablet 0  . losartan-hydrochlorothiazide (HYZAAR) 100-12.5 MG tablet Take 1 tablet by mouth once daily 90 tablet 0  . metFORMIN (GLUCOPHAGE) 500 MG tablet TAKE 1/2 (ONE-HALF) TABLET BY MOUTH ONCE DAILY WITH BREAKFAST. PLEASE SCHEDULE AN OFFICE VISIT FOR KOPRE REFILLS 45 tablet 0  . omega-3 acid ethyl esters (LOVAZA) 1 g capsule Take by mouth 2 (two) times daily.    . phentermine 15 MG capsule Take 1 capsule (15 mg total) by mouth every morning. 30 capsule 0   No current facility-administered  medications on file prior to visit.    BP 132/82 (BP Location: Left Arm, Patient Position: Sitting, Cuff Size: Large)   Pulse (!) 105   Temp 98.5 F (36.9 C) (Oral)   Ht 6' (1.829 m)   Wt (!) 311 lb 9.6 oz (141.3 kg)   SpO2 97%   BMI 42.26 kg/m       Objective:   Physical Exam Vitals and nursing note reviewed.  Constitutional:      Appearance: Normal appearance.  Cardiovascular:     Rate and Rhythm: Normal rate and regular rhythm.     Heart sounds: Normal heart sounds.  Pulmonary:     Effort: Pulmonary effort is normal.     Breath sounds: Normal breath sounds.  Musculoskeletal:        General: Tenderness present. Normal range of motion.     Right hip: Normal.     Left hip: Normal. No deformity, lacerations, tenderness, bony tenderness or crepitus. Normal range of motion. Normal strength.     Right knee: Normal.     Instability Tests: Anterior drawer test negative. Posterior drawer test negative. Anterior Lachman test negative. Medial McMurray test negative and lateral McMurray test negative.     Left knee: Normal.     Instability Tests: Anterior drawer test negative. Posterior drawer test negative. Anterior Lachman test negative. Medial McMurray test negative and lateral McMurray test negative.     Right lower leg: Tenderness (On the lateral aspect of the right lower leg.  No swelling, redness, or warmth noted) present. No edema.     Left lower leg: Normal. No edema.     Comments: Pain on the lateral aspect of the left hip with internal rotation.  He has groin pain bilaterally with internal and external rotation  Skin:    General: Skin is warm and dry.     Capillary Refill: Capillary refill takes less than 2 seconds.  Neurological:     General: No focal deficit present.     Mental Status: He is alert and oriented to person, place, and time.     Cranial Nerves: Cranial nerves are intact.     Sensory: Sensation is intact.     Motor: Motor function is intact.      Coordination: Coordination is intact.     Gait: Gait is intact.  Psychiatric:        Mood and Affect: Mood normal.        Behavior: Behavior normal.        Thought Content: Thought content normal.  Judgment: Judgment normal.       Assessment & Plan:  1. Multiple joint pain -Total joint pain, will check ANA, sed rate, and C-reactive protein as well as get x-rays of the left hip, bilateral knees.  Unknown cause at this time, but I believe this is due to being overweight.  He was encouraged to work on weight loss to help with joint stability.  Consider referral to orthopedics or sports medicine in the future.  If ANA comes back positive we will send over to rheumatology - ANA; Future - DG Lumbar Spine Complete; Future - DG Hip Unilat W OR W/O Pelvis 2-3 Views Right; Future - DG Knee 1-2 Views Right; Future - DG Knee 1-2 Views Left; Future - C-reactive Protein; Future - Sedimentation Rate; Future - ANA - Sedimentation Rate - C-reactive Protein - DG Hip Unilat W OR W/O Pelvis 2-3 Views Left; Future   2. Carpal tunnel syndrome on both sides  - predniSONE (DELTASONE) 10 MG tablet; Take 20 mg ( 2 tabs) daily x 7 days and then 10 mg ( 1 tab) daily for 7 days  Dispense: 21 tablet; Refill: 0

## 2020-03-21 LAB — SEDIMENTATION RATE: Sed Rate: 23 mm/hr — ABNORMAL HIGH (ref 0–15)

## 2020-03-21 LAB — C-REACTIVE PROTEIN: CRP: 1.1 mg/dL (ref 0.5–20.0)

## 2020-03-22 ENCOUNTER — Ambulatory Visit
Admission: RE | Admit: 2020-03-22 | Discharge: 2020-03-22 | Disposition: A | Discharge status: Home / Self Care | Payer: BC Managed Care – PPO | Source: Ambulatory Visit | Attending: Adult Health | Admitting: Adult Health

## 2020-03-22 ENCOUNTER — Telehealth: Payer: Self-pay | Admitting: Adult Health

## 2020-03-22 ENCOUNTER — Other Ambulatory Visit: Payer: Self-pay

## 2020-03-22 ENCOUNTER — Encounter: Payer: Self-pay | Admitting: Adult Health

## 2020-03-22 DIAGNOSIS — I861 Scrotal varices: Secondary | ICD-10-CM

## 2020-03-22 LAB — ANTI-NUCLEAR AB-TITER (ANA TITER): ANA Titer 1: 1:40 {titer} — ABNORMAL HIGH

## 2020-03-22 LAB — ANA: Anti Nuclear Antibody (ANA): POSITIVE — AB

## 2020-03-22 MED ORDER — IOPAMIDOL (ISOVUE-300) INJECTION 61%
100.0000 mL | Freq: Once | INTRAVENOUS | Status: AC | PRN
  Administered 2020-03-22: 09:00:00 100 mL via INTRAVENOUS

## 2020-03-22 NOTE — Telephone Encounter (Signed)
Spoke to patient informed him of his lab results as well as his recent CT of the abdomen which showed  IMPRESSION: 1. No explanation for patient's right-sided testicular/scrotal pain. Specifically, no evidence of a hypertrophied gonadal vein or scrotal varicocele. Further evaluation with testicular ultrasound could be performed as indicated. 2. Advanced hepatic steatosis.  Correlation with LFTs is advised. 3. Apparent circumferential wall thickening involving the gastric antrum and body of the stomach, potentially artifactual due to underdistention. Clinical correlation is advised. Further evaluation with endoscopy could be performed as indicated.  He was advised that he needs to make drastic changes and that this should be a wake-up call for him.  I would like him to focus on exercise and low sugar low-carb diet.  We will likely repeat his CT of the abdomen or do an ultrasound of the right upper quadrant in 6 months to a year.

## 2020-04-07 ENCOUNTER — Other Ambulatory Visit: Payer: Self-pay | Admitting: Adult Health

## 2020-04-07 DIAGNOSIS — I517 Cardiomegaly: Secondary | ICD-10-CM

## 2020-04-07 DIAGNOSIS — Z0000 Encounter for general adult medical examination without abnormal findings: Secondary | ICD-10-CM

## 2020-04-07 DIAGNOSIS — I10 Essential (primary) hypertension: Secondary | ICD-10-CM

## 2020-04-10 NOTE — Telephone Encounter (Signed)
DENIED.  PT IS PAST DUE FOR CPX. 

## 2020-04-11 ENCOUNTER — Telehealth: Payer: Self-pay | Admitting: Adult Health

## 2020-04-11 DIAGNOSIS — I517 Cardiomegaly: Secondary | ICD-10-CM

## 2020-04-11 DIAGNOSIS — I10 Essential (primary) hypertension: Secondary | ICD-10-CM

## 2020-04-11 DIAGNOSIS — Z0000 Encounter for general adult medical examination without abnormal findings: Secondary | ICD-10-CM

## 2020-04-11 DIAGNOSIS — E063 Autoimmune thyroiditis: Secondary | ICD-10-CM

## 2020-04-11 NOTE — Telephone Encounter (Signed)
Pt is calling in stating that he needs refills on Rx's losartan-hydrochlorothiazide (HYZAAR 100-12.5 MG and levothyroxine (SYNTHROID) 50 MG and metformin (GLUCOPHAGE) 500 MG   Pharm:  [facility] on [address]

## 2020-04-12 MED ORDER — LOSARTAN POTASSIUM-HCTZ 100-12.5 MG PO TABS: 1.0000 | ORAL_TABLET | Freq: Every day | ORAL | 0 refills | Status: DC

## 2020-04-12 MED ORDER — METFORMIN HCL 500 MG PO TABS: ORAL_TABLET | ORAL | 0 refills | Status: DC

## 2020-04-12 MED ORDER — LEVOTHYROXINE SODIUM 50 MCG PO TABS: ORAL_TABLET | ORAL | 0 refills | Status: DC

## 2020-04-12 NOTE — Telephone Encounter (Signed)
Sent to the pharmacy by e-scribe as instructed.  Nothing further needed. 

## 2020-04-12 NOTE — Telephone Encounter (Signed)
Ok to send in 30 days of each medication

## 2020-04-12 NOTE — Telephone Encounter (Signed)
Last CPX was 01/05/2019.  He has made a CPX appointment.  Please advise.

## 2020-05-07 ENCOUNTER — Other Ambulatory Visit: Payer: Self-pay

## 2020-05-08 ENCOUNTER — Ambulatory Visit (INDEPENDENT_AMBULATORY_CARE_PROVIDER_SITE_OTHER): Payer: BC Managed Care – PPO | Admitting: Adult Health

## 2020-05-08 ENCOUNTER — Encounter: Payer: Self-pay | Admitting: Adult Health

## 2020-05-08 ENCOUNTER — Telehealth: Payer: Self-pay | Admitting: Adult Health

## 2020-05-08 ENCOUNTER — Other Ambulatory Visit: Payer: Self-pay | Admitting: Adult Health

## 2020-05-08 VITALS — BP 150/100 | Temp 99.0°F | Ht 73.0 in | Wt 309.0 lb

## 2020-05-08 DIAGNOSIS — Z125 Encounter for screening for malignant neoplasm of prostate: Secondary | ICD-10-CM

## 2020-05-08 DIAGNOSIS — E063 Autoimmune thyroiditis: Secondary | ICD-10-CM | POA: Diagnosis not present

## 2020-05-08 DIAGNOSIS — E7439 Other disorders of intestinal carbohydrate absorption: Secondary | ICD-10-CM

## 2020-05-08 DIAGNOSIS — E119 Type 2 diabetes mellitus without complications: Secondary | ICD-10-CM

## 2020-05-08 DIAGNOSIS — I10 Essential (primary) hypertension: Secondary | ICD-10-CM

## 2020-05-08 DIAGNOSIS — Z111 Encounter for screening for respiratory tuberculosis: Secondary | ICD-10-CM

## 2020-05-08 DIAGNOSIS — Z6841 Body Mass Index (BMI) 40.0 and over, adult: Secondary | ICD-10-CM

## 2020-05-08 DIAGNOSIS — Z0000 Encounter for general adult medical examination without abnormal findings: Secondary | ICD-10-CM | POA: Diagnosis not present

## 2020-05-08 DIAGNOSIS — Z1211 Encounter for screening for malignant neoplasm of colon: Secondary | ICD-10-CM

## 2020-05-08 LAB — HEMOGLOBIN A1C: Hgb A1c MFr Bld: 7.5 % — ABNORMAL HIGH (ref 4.6–6.5)

## 2020-05-08 LAB — CBC WITH DIFFERENTIAL/PLATELET
Basophils Absolute: 0 10*3/uL (ref 0.0–0.1)
Basophils Relative: 0.1 % (ref 0.0–3.0)
Eosinophils Absolute: 0.1 10*3/uL (ref 0.0–0.7)
Eosinophils Relative: 3.7 % (ref 0.0–5.0)
HCT: 41.6 % (ref 39.0–52.0)
Hemoglobin: 13.7 g/dL (ref 13.0–17.0)
Lymphocytes Relative: 46.8 % — ABNORMAL HIGH (ref 12.0–46.0)
Lymphs Abs: 1.5 10*3/uL (ref 0.7–4.0)
MCHC: 32.9 g/dL (ref 30.0–36.0)
MCV: 79.1 fl (ref 78.0–100.0)
Monocytes Absolute: 0.5 10*3/uL (ref 0.1–1.0)
Monocytes Relative: 14.2 % — ABNORMAL HIGH (ref 3.0–12.0)
Neutro Abs: 1.1 10*3/uL — ABNORMAL LOW (ref 1.4–7.7)
Neutrophils Relative %: 35.2 % — ABNORMAL LOW (ref 43.0–77.0)
Platelets: 232 10*3/uL (ref 150.0–400.0)
RBC: 5.26 Mil/uL (ref 4.22–5.81)
RDW: 14.9 % (ref 11.5–15.5)
WBC: 3.2 10*3/uL — ABNORMAL LOW (ref 4.0–10.5)

## 2020-05-08 LAB — COMPREHENSIVE METABOLIC PANEL
ALT: 35 U/L (ref 0–53)
AST: 26 U/L (ref 0–37)
Albumin: 4.1 g/dL (ref 3.5–5.2)
Alkaline Phosphatase: 73 U/L (ref 39–117)
BUN: 12 mg/dL (ref 6–23)
CO2: 31 mEq/L (ref 19–32)
Calcium: 9.1 mg/dL (ref 8.4–10.5)
Chloride: 101 mEq/L (ref 96–112)
Creatinine, Ser: 1.41 mg/dL (ref 0.40–1.50)
GFR: 59.49 mL/min — ABNORMAL LOW (ref >60.00)
Glucose, Bld: 125 mg/dL — ABNORMAL HIGH (ref 70–99)
Potassium: 4.1 mEq/L (ref 3.5–5.1)
Sodium: 138 mEq/L (ref 135–145)
Total Bilirubin: 0.4 mg/dL (ref 0.2–1.2)
Total Protein: 7.4 g/dL (ref 6.0–8.3)

## 2020-05-08 LAB — PSA: PSA: 0.31 ng/mL (ref 0.10–4.00)

## 2020-05-08 LAB — LIPID PANEL
Cholesterol: 143 mg/dL (ref 0–200)
HDL: 29.9 mg/dL — ABNORMAL LOW (ref >39.00)
NonHDL: 112.81
Total CHOL/HDL Ratio: 5
Triglycerides: 367 mg/dL — ABNORMAL HIGH (ref 0.0–149.0)
VLDL: 73.4 mg/dL — ABNORMAL HIGH (ref 0.0–40.0)

## 2020-05-08 LAB — LDL CHOLESTEROL, DIRECT: Direct LDL: 72 mg/dL

## 2020-05-08 LAB — TSH: TSH: 3.25 u[IU]/mL (ref 0.35–4.50)

## 2020-05-08 MED ORDER — METFORMIN HCL 500 MG PO TABS: 500.0000 mg | ORAL_TABLET | Freq: Two times a day (BID) | ORAL | 0 refills | Status: DC

## 2020-05-08 MED ORDER — TRULICITY 0.75 MG/0.5ML ~~LOC~~ SOAJ: 0.7500 mg | SUBCUTANEOUS | 0 refills | Status: DC

## 2020-05-08 MED ORDER — FREESTYLE LIBRE 2 SENSOR MISC: 0 refills | Status: DC

## 2020-05-08 NOTE — Telephone Encounter (Signed)
Updated patient on his labs, his A1c did increase to 7.5.  Will place him on Trulicity 0.75 mg weekly and increase Metformin to 500 mg twice daily.  He would like to try the [facility] system to monitor his blood sugars so I will send this in as well.  He was advised to follow-up in the office in 3 months

## 2020-05-08 NOTE — Patient Instructions (Addendum)
It was great seeing you today   We will follow up with you regarding your blood work   Continue to work on weight loss with diet and exercise   Follow up in 48-72 hours to have your TB read   Please let me know if you need anything

## 2020-05-08 NOTE — Progress Notes (Signed)
Subjective:    Patient ID: Shawn Becker, male    DOB: 1970/11/27, 50 y.o.   MRN: [id number]  HPI Patient presents for yearly preventative medicine examination. He is a pleasant 50 year old male who  has a past medical history of Allergy, Fatty liver, Hypertension, Hypothyroidism, Left ventricular hypertrophy, and Pre-diabetes.  Essential Hypertension -currently well controlled on Hyzaar 100-25 mg and Toprol 25 mg.  He is followed by cardiology for LVH.  His last echo in 2019 showed no significant valvular abnormalities, very mild thickening of the heart with a normal EF.  He denies chest pain, shortness of breath, dizziness, lightheadedness, or syncopal episodes. He took his BP meds, just prior to arrival.  BP Readings from Last 3 Encounters:  05/08/20 (!) 150/100  03/20/20 132/82  02/10/20 (!) 146/98   Hashimoto's thyroiditis-controlled on Synthroid 25 mcg daily  Glucose Intolerance -currently prescribed Metformin 250 mg daily.  His last A1c in February 2021 was 6.5.  All immunizations and health maintenance protocols were reviewed with the patient and needed orders were placed.  Appropriate screening laboratory values were ordered for the patient including screening of hyperlipidemia, renal function and hepatic function. If indicated by BPH, a PSA was ordered.  Medication reconciliation,  past medical history, social history, problem list and allergies were reviewed in detail with the patient  Goals were established with regard to weight loss, exercise, and  diet in compliance with medications. He reports that he has not really been working on weight loss yet. He plans to start getting into it  Wt Readings from Last 3 Encounters:  05/08/20 (!) 309 lb (140.2 kg)  03/20/20 (!) 311 lb 9.6 oz (141.3 kg)  02/10/20 (!) 303 lb 3.2 oz (137.5 kg)   He is due for a colonoscopy.    Review of Systems  Constitutional: Negative.   HENT: Negative.   Eyes: Negative.    Respiratory: Negative.   Cardiovascular: Negative.   Gastrointestinal: Negative.   Endocrine: Negative.   Genitourinary: Negative.   Musculoskeletal: Negative.   Skin: Negative.   Allergic/Immunologic: Negative.   Neurological: Negative.   Hematological: Negative.   Psychiatric/Behavioral: Negative.   All other systems reviewed and are negative.  Past Medical History:  Diagnosis Date  . Allergy   . Fatty liver   . Hypertension   . Hypothyroidism   . Left ventricular hypertrophy   . Pre-diabetes     Social History   Socioeconomic History  . Marital status: Married    Spouse name: Vevelyn Francois  . Number of children: Not on file  . Years of education: Not on file  . Highest education level: Not on file  Occupational History  . 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 Smoking status: Never Smoker  . Smokeless tobacco: Never Used  Vaping Use  . Vaping Use: Never used  Substance and Sexual Activity  . Alcohol use: No    Alcohol/week: 0.0 standard drinks  . Drug use: No  . Sexual activity: Not on file  Other Topics Concern  . Not on file  Social History Narrative   Renato Gails    Married    4 children          He likes to ride his motorcycle.       Social Determinants of Health   Financial Resource Strain: Not on file  Food Insecurity: Not on file  Transportation Needs: Not on file  Physical Activity: Not on file  Stress: Not on file  Social  Connections: Not on file  Intimate Partner Violence: Not on file    History reviewed. No pertinent surgical history.  Family History  Problem Relation Age of Onset  . Diabetes Mother   . Hypertension Mother   . Mental illness Mother   . Bipolar disorder Mother   . Schizophrenia Mother   . Obesity Mother   . Diabetes Maternal Grandmother   . Hypertension Maternal Grandmother   . Cancer Maternal Grandfather        colon cancer  . Hyperlipidemia Paternal Grandmother   . Cancer Paternal Grandfather         prostate cancer  . Alcoholism Father   . Drug abuse Father     Allergies  Allergen Reactions  . Penicillins   . Shellfish Allergy     Current Outpatient Medications on File Prior to Visit  Medication Sig Dispense Refill  . Cholecalciferol (VITAMIN D-3) 125 MCG (5000 UT) TABS Take by mouth.    . levothyroxine (EUTHYROX) 50 MCG tablet TAKE 1 TABLET BY MOUTH ONCE DAILY BEFORE BREAKFAST 30 tablet 0  . losartan-hydrochlorothiazide (HYZAAR) 100-12.5 MG tablet Take 1 tablet by mouth daily. 30 tablet 0  . metFORMIN (GLUCOPHAGE) 500 MG tablet TAKE 1/2 (ONE-HALF) TABLET BY MOUTH ONCE DAILY WITH BREAKFAST. 15 tablet 0  . omega-3 acid ethyl esters (LOVAZA) 1 g capsule Take by mouth 2 (two) times daily.     No current facility-administered medications on file prior to visit.    BP (!) 150/100   Temp 99 F (37.2 C)   Ht 6\' 1"  (1.854 m)   Wt (!) 309 lb (140.2 kg)   BMI 40.77 kg/m       Objective:   Physical Exam Vitals and nursing note reviewed.  Constitutional:      General: He is not in acute distress.    Appearance: Normal appearance. He is well-developed and normal weight.  HENT:     Head: Normocephalic and atraumatic.     Right Ear: Tympanic membrane, ear canal and external ear normal. There is no impacted cerumen.     Left Ear: Tympanic membrane, ear canal and external ear normal. There is no impacted cerumen.     Nose: Nose normal. No congestion or rhinorrhea.     Mouth/Throat:     Mouth: Mucous membranes are moist.     Pharynx: Oropharynx is clear. No oropharyngeal exudate or posterior oropharyngeal erythema.  Eyes:     General:        Right eye: No discharge.        Left eye: No discharge.     Extraocular Movements: Extraocular movements intact.     Conjunctiva/sclera: Conjunctivae normal.     Pupils: Pupils are equal, round, and reactive to light.  Neck:     Vascular: No carotid bruit.     Trachea: No tracheal deviation.  Cardiovascular:     Rate and Rhythm: Normal  rate and regular rhythm.     Pulses: Normal pulses.     Heart sounds: Normal heart sounds. No murmur heard. No friction rub. No gallop.   Pulmonary:     Effort: Pulmonary effort is normal. No respiratory distress.     Breath sounds: Normal breath sounds. No stridor. No wheezing, rhonchi or rales.  Chest:     Chest wall: No tenderness.  Abdominal:     General: Bowel sounds are normal. There is no distension.     Palpations: Abdomen is soft. There is no mass.  Tenderness: There is no abdominal tenderness. There is no right CVA tenderness, left CVA tenderness, guarding or rebound.     Hernia: No hernia is present.  Musculoskeletal:        General: No swelling, tenderness, deformity or signs of injury. Normal range of motion.     Right lower leg: No edema.     Left lower leg: No edema.  Lymphadenopathy:     Cervical: No cervical adenopathy.  Skin:    General: Skin is warm and dry.     Capillary Refill: Capillary refill takes less than 2 seconds.     Coloration: Skin is not jaundiced or pale.     Findings: No bruising, erythema, lesion or rash.  Neurological:     General: No focal deficit present.     Mental Status: He is alert and oriented to person, place, and time.     Cranial Nerves: No cranial nerve deficit.     Sensory: No sensory deficit.     Motor: No weakness.     Coordination: Coordination normal.     Gait: Gait normal.     Deep Tendon Reflexes: Reflexes normal.  Psychiatric:        Mood and Affect: Mood normal.        Behavior: Behavior normal.        Thought Content: Thought content normal.        Judgment: Judgment normal.       Assessment & Plan:  1. Routine general medical examination at a health care facility - Follow up in one year or sooner if needed - Work on weight loss through diet and exercise  - CBC with Differential/Platelet; Future - Comprehensive metabolic panel; Future - Hemoglobin A1c; Future - Lipid panel; Future - TSH; Future - TSH -  Lipid panel - Hemoglobin A1c - Comprehensive metabolic panel - CBC with Differential/Platelet  2. Essential hypertension - BP normally controlled. No Changes  - CBC with Differential/Platelet; Future - Comprehensive metabolic panel; Future - Hemoglobin A1c; Future - Lipid panel; Future - TSH; Future - TSH - Lipid panel - Hemoglobin A1c - Comprehensive metabolic panel - CBC with Differential/Platelet  3. Hashimoto's thyroiditis - Consider increase in synthroid  - CBC with Differential/Platelet; Future - Comprehensive metabolic panel; Future - Hemoglobin A1c; Future - Lipid panel; Future - TSH; Future - TSH - Lipid panel - Hemoglobin A1c - Comprehensive metabolic panel - CBC with Differential/Platelet  4. Class 3 severe obesity due to excess calories with serious comorbidity and body mass index (BMI) of 40.0 to 44.9 in adult [facility]) - encouraged lifestyle modifications  - Follow up in one year or sooner if needed - CBC with Differential/Platelet; Future - Comprehensive metabolic panel; Future - Hemoglobin A1c; Future - Lipid panel; Future - TSH; Future - TSH - Lipid panel - Hemoglobin A1c - Comprehensive metabolic panel - CBC with Differential/Platelet  5. Glucose intolerance  - CBC with Differential/Platelet; Future - Comprehensive metabolic panel; Future - Hemoglobin A1c; Future - Lipid panel; Future - TSH; Future - TSH - Lipid panel - Hemoglobin A1c - Comprehensive metabolic panel - CBC with Differential/Platelet  6. Prostate cancer screening  - PSA; Future - PSA  7. Colon cancer screening  - Ambulatory referral to Gastroenterology  Shirline Frees, NP

## 2020-05-10 LAB — TB SKIN TEST
Induration: 0 mm
TB Skin Test: NEGATIVE

## 2020-05-10 NOTE — Addendum Note (Signed)
Addended by: Raj Janus T on: 05/10/2020 03:37 PM   Modules accepted: Orders

## 2020-05-11 ENCOUNTER — Other Ambulatory Visit: Payer: Self-pay | Admitting: Adult Health

## 2020-05-11 DIAGNOSIS — I10 Essential (primary) hypertension: Secondary | ICD-10-CM

## 2020-05-11 DIAGNOSIS — E063 Autoimmune thyroiditis: Secondary | ICD-10-CM

## 2020-05-11 DIAGNOSIS — Z0000 Encounter for general adult medical examination without abnormal findings: Secondary | ICD-10-CM

## 2020-05-11 DIAGNOSIS — I517 Cardiomegaly: Secondary | ICD-10-CM

## 2020-05-11 NOTE — Telephone Encounter (Signed)
Sent to the pharmacy by e-scribe. 

## 2020-05-22 ENCOUNTER — Encounter: Payer: Self-pay | Admitting: Gastroenterology

## 2020-06-08 ENCOUNTER — Encounter: Payer: Self-pay | Admitting: Adult Health

## 2020-06-14 ENCOUNTER — Other Ambulatory Visit: Payer: Self-pay

## 2020-06-15 ENCOUNTER — Ambulatory Visit (INDEPENDENT_AMBULATORY_CARE_PROVIDER_SITE_OTHER): Payer: BC Managed Care – PPO | Admitting: Adult Health

## 2020-06-15 ENCOUNTER — Encounter: Payer: Self-pay | Admitting: Adult Health

## 2020-06-15 VITALS — BP 132/74 | HR 91 | Temp 98.4°F | Wt 286.6 lb

## 2020-06-15 DIAGNOSIS — E119 Type 2 diabetes mellitus without complications: Secondary | ICD-10-CM

## 2020-06-15 DIAGNOSIS — Z6841 Body Mass Index (BMI) 40.0 and over, adult: Secondary | ICD-10-CM | POA: Diagnosis not present

## 2020-06-15 MED ORDER — PHENTERMINE HCL 15 MG PO CAPS: 15.0000 mg | ORAL_CAPSULE | ORAL | 1 refills | Status: DC

## 2020-06-15 NOTE — Progress Notes (Signed)
Subjective:    Patient ID: Shawn Becker, male    DOB: Jan 26, 1971, 50 y.o.   MRN: [id number]  HPI 50 year old male who  has a past medical history of Allergy, Fatty liver, Hypertension, Hypothyroidism, Left ventricular hypertrophy, and Pre-diabetes.  He presents to the office today for follow up regarding obesity management. In the past we had tried Phentermine with him and he did well with this medication. He was recently diagnosed with diabetes which is currently being managed with Trulicity and Metformin.  He reports that about a month ago stop Metformin due to joint pain and reports that his joint pain has since resolved.  He has having no issues with Trulicity.  As for diet he continues to eat fried foods but has cut out sodas and is eating healthier overall.  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  He had phentermine leftover which he has been taking and has noticed improvement in weight loss.  Wt Readings from Last 3 Encounters:  06/15/20 286 lb 9.6 oz (130 kg)  05/08/20 (!) 309 lb (140.2 kg)  03/20/20 (!) 311 lb 9.6 oz (141.3 kg)    Review of Systems See HPI   Past Medical History:  Diagnosis Date  . Allergy   . Fatty liver   . Hypertension   . Hypothyroidism   . Left ventricular hypertrophy   . Pre-diabetes     Social History   Socioeconomic History  . Marital status: Married    Spouse name: Vevelyn Francois  . Number of children: Not on file  . Years of education: Not on file  . Highest education level: Not on file  Occupational History  . 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 Smoking status: Never Smoker  . Smokeless tobacco: Never Used  Vaping Use  . Vaping Use: Never used  Substance and Sexual Activity  . Alcohol use: No    Alcohol/week: 0.0 standard drinks  . Drug use: No  . Sexual activity: Not on file  Other Topics Concern  . Not on file  Social History Narrative    Renato Gails    Married    4 children          He likes to ride his motorcycle.       Social Determinants of Health   Financial Resource Strain: Not on file  Food Insecurity: Not on file  Transportation Needs: Not on file  Physical Activity: Not on file  Stress: Not on file  Social Connections: Not on file  Intimate Partner Violence: Not on file    No past surgical history on file.  Family History  Problem Relation Age of Onset  . Diabetes Mother   . Hypertension Mother   . Mental illness Mother   . Bipolar disorder Mother   . Schizophrenia Mother   . Obesity Mother   . Diabetes Maternal Grandmother   . Hypertension Maternal Grandmother   . Cancer Maternal Grandfather        colon cancer  . Hyperlipidemia Paternal Grandmother   . Cancer Paternal Grandfather        prostate cancer  . Alcoholism Father   . Drug abuse Father     Allergies  Allergen Reactions  . Penicillins   . Shellfish Allergy     Current Outpatient Medications on File Prior to Visit  Medication Sig Dispense Refill  . Cholecalciferol (VITAMIN D-3) 125 MCG (5000 UT)  TABS Take by mouth.    . Dulaglutide (TRULICITY) 0.75 MG/0.5ML SOPN Inject 0.75 mg into the skin once a week. 6 mL 0  . EUTHYROX 50 MCG tablet TAKE 1 TABLET BY MOUTH ONCE DAILY BEFORE BREAKFAST 90 tablet 3  . losartan-hydrochlorothiazide (HYZAAR) 100-12.5 MG tablet Take 1 tablet by mouth once daily 90 tablet 3  . omega-3 acid ethyl esters (LOVAZA) 1 g capsule Take by mouth 2 (two) times daily.     No current facility-administered medications on file prior to visit.    BP 132/74 (BP Location: Left Arm, Patient Position: Sitting, Cuff Size: Normal)   Pulse 91   Temp 98.4 F (36.9 C) (Oral)   Wt 286 lb 9.6 oz (130 kg)   SpO2 94%   BMI 37.81 kg/m       Objective:   Physical Exam Vitals and nursing note reviewed.  Constitutional:      Appearance: Normal appearance. He is obese.  Cardiovascular:     Rate and Rhythm: Normal  rate and regular rhythm.     Pulses: Normal pulses.     Heart sounds: Normal heart sounds.  Pulmonary:     Effort: Pulmonary effort is normal.     Breath sounds: Normal breath sounds.  Skin:    General: Skin is warm and dry.  Neurological:     General: No focal deficit present.     Mental Status: He is alert and oriented to person, place, and time.  Psychiatric:        Mood and Affect: Mood normal.        Behavior: Behavior normal.        Thought Content: Thought content normal.        Judgment: Judgment normal.       Assessment & Plan:  1. Class 3 severe obesity due to excess calories with serious comorbidity and body mass index (BMI) of 40.0 to 44.9 in adult [facility]) -We will restart on low-dose phentermine.  Would like to keep him on as short as possible.  Hopefully we can manage his weight with Trulicity.  We will send in 2 months and reevaluate at his next follow-up. - phentermine 15 MG capsule; Take 1 capsule (15 mg total) by mouth every morning.  Dispense: 30 capsule; Refill: 1  2. Controlled type 2 diabetes mellitus without complication, without long-term current use of insulin (HCC) - Continue with trulicity  - will recheck A1c in two months    Shirline Frees, NP

## 2020-07-09 ENCOUNTER — Other Ambulatory Visit: Payer: Self-pay

## 2020-07-09 ENCOUNTER — Ambulatory Visit (AMBULATORY_SURGERY_CENTER): Payer: Self-pay

## 2020-07-09 VITALS — Ht 73.0 in | Wt 275.0 lb

## 2020-07-09 DIAGNOSIS — Z1211 Encounter for screening for malignant neoplasm of colon: Secondary | ICD-10-CM

## 2020-07-09 MED ORDER — SUTAB 1479-225-188 MG PO TABS: 12.0000 | ORAL_TABLET | ORAL | 0 refills | Status: DC

## 2020-07-09 NOTE — Progress Notes (Signed)
No egg or soy allergy known to patient  No issues with past sedation with any surgeries or procedures Patient denies ever being told they had issues or difficulty with intubation  No FH of Malignant Hyperthermia  TAKING PHENTERMINE  diet pills per patient BUT HE STATES HE WILL MOST LIKELY BE DONE BY THE DAY OF PROCEDURE, PT TO HOLD FOR 10 DAYS PRIOR TO PROCEDURE IF HE IS STILL TAKING THEM.  No home 02 use per patient  No blood thinners per patient  Pt denies issues with constipation  No A fib or A flutter  EMMI video to pt or via MyChart  COVID 19 guidelines implemented in PV today with Pt and RN  Pt is fully vaccinated  for [employer] given to pt in PV today , Code to Pharmacy and  NO PA's for preps discussed with pt In PV today  Discussed with pt there will be an out-of-pocket cost for prep and that varies from $0 to 70 dollars   Due to the COVID-19 pandemic we are asking patients to follow certain guidelines.  Pt aware of COVID protocols and [facility] guidelines

## 2020-07-25 ENCOUNTER — Other Ambulatory Visit: Payer: Self-pay | Admitting: Adult Health

## 2020-07-25 DIAGNOSIS — E119 Type 2 diabetes mellitus without complications: Secondary | ICD-10-CM

## 2020-07-30 ENCOUNTER — Encounter: Payer: BC Managed Care – PPO | Admitting: Gastroenterology

## 2020-07-30 ENCOUNTER — Encounter: Payer: Self-pay | Admitting: Adult Health

## 2020-08-07 ENCOUNTER — Encounter: Payer: Self-pay | Admitting: Adult Health

## 2020-08-07 ENCOUNTER — Other Ambulatory Visit: Payer: Self-pay

## 2020-08-07 ENCOUNTER — Ambulatory Visit (INDEPENDENT_AMBULATORY_CARE_PROVIDER_SITE_OTHER): Payer: BC Managed Care – PPO | Admitting: Adult Health

## 2020-08-07 DIAGNOSIS — E119 Type 2 diabetes mellitus without complications: Secondary | ICD-10-CM | POA: Diagnosis not present

## 2020-08-07 DIAGNOSIS — Z6841 Body Mass Index (BMI) 40.0 and over, adult: Secondary | ICD-10-CM | POA: Diagnosis not present

## 2020-08-07 DIAGNOSIS — I10 Essential (primary) hypertension: Secondary | ICD-10-CM | POA: Diagnosis not present

## 2020-08-07 LAB — POCT GLYCOSYLATED HEMOGLOBIN (HGB A1C): HbA1c, POC (prediabetic range): 5.7 % (ref 5.7–6.4)

## 2020-08-07 MED ORDER — PHENTERMINE HCL 30 MG PO CAPS: 30.0000 mg | ORAL_CAPSULE | ORAL | 2 refills | Status: DC

## 2020-08-07 NOTE — Progress Notes (Signed)
Subjective:    Patient ID: Shawn Becker, male    DOB: [birth year], 50 y.o.   MRN: [id number]  HPI 50 year old male who  has a past medical history of Allergy, Arthritis, Fatty liver, Hypertension, Hypothyroidism, Left ventricular hypertrophy, and Pre-diabetes.  He presents to the office today for follow-up regarding diabetes, hypertension, and obesity.  DM -sent diagnosis 3 months ago.  Was started on Trulicity and metformin, but metformin was stopped due to joint pain.  He has been tolerating Trulicity 0.75 mg well and has not experienced any GI issues. He has cut back on carbs and sugars.   Lab Results  Component Value Date   HGBA1C 5.7 08/07/2020   HTN -well controlled with Hyzaar 100-12.5 mg daily.  He denies dizziness, lightheadedness, chest pain, shortness of breath BP Readings from Last 3 Encounters:  08/07/20 132/70  06/15/20 132/74  05/08/20 (!) 150/100   Obesity -currently prescribed phentermine 15 mg daily.  He has been tolerating this medication well, denies side effects such as tachycardia, insomnia, or headaches.He has been able to lose about 35 pounds but reports that he has hit a plateau over the last month and has not been able to lose any weight. He finishes up the semester at school and is excited to get back to exercising.    Wt Readings from Last 5 Encounters:  08/07/20 274 lb 4 oz (124.4 kg)  07/09/20 275 lb (124.7 kg)  06/15/20 286 lb 9.6 oz (130 kg)  05/08/20 (!) 309 lb (140.2 kg)  03/20/20 (!) 311 lb 9.6 oz (141.3 kg)  ] Review of Systems See HPI   Past Medical History:  Diagnosis Date  . Allergy    pollen  . Arthritis   . Fatty liver   . Hypertension   . Hypothyroidism   . Left ventricular hypertrophy   . Pre-diabetes     Social History   Socioeconomic History  . Marital status: Married    Spouse name: Vevelyn Francois  . Number of children: Not on file  . Years of education: Not on file  . Highest education level: Not on file   Occupational History  . 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 Smoking status: Never Smoker  . Smokeless tobacco: Never Used  Vaping Use  . Vaping Use: Never used  Substance and Sexual Activity  . Alcohol use: No    Alcohol/week: 0.0 standard drinks  . Drug use: No  . Sexual activity: Not on file  Other Topics Concern  . Not on file  Social History Narrative   Renato Gails    Married    4 children          He likes to ride his motorcycle.       Social Determinants of Health   Financial Resource Strain: Not on file  Food Insecurity: Not on file  Transportation Needs: Not on file  Physical Activity: Not on file  Stress: Not on file  Social Connections: Not on file  Intimate Partner Violence: Not on file    History reviewed. No pertinent surgical history.  Family History  Problem Relation Age of Onset  . Diabetes Mother   . Hypertension Mother   . Mental illness Mother   . Bipolar disorder Mother   . Schizophrenia Mother   . Obesity Mother   . Diabetes Maternal Grandmother   . Hypertension Maternal Grandmother   . Cancer Maternal Grandfather        colon cancer  . Colon  cancer Maternal Grandfather   . Colon polyps Maternal Grandfather   . Hyperlipidemia Paternal Grandmother   . Cancer Paternal Grandfather        prostate cancer  . Alcoholism Father   . Drug abuse Father   . Colon polyps Maternal Uncle   . Esophageal cancer Neg Hx   . Rectal cancer Neg Hx   . Stomach cancer Neg Hx     Allergies  Allergen Reactions  . Penicillins   . Shellfish Allergy     Current Outpatient Medications on File Prior to Visit  Medication Sig Dispense Refill  . Cholecalciferol (VITAMIN D-3) 125 MCG (5000 UT) TABS Take by mouth.    Janann August 50 MCG tablet TAKE 1 TABLET BY MOUTH ONCE DAILY BEFORE BREAKFAST 90 tablet 3  . losartan-hydrochlorothiazide (HYZAAR) 100-12.5 MG tablet Take 1 tablet by mouth once daily 90 tablet 3  . Multiple Vitamin (MULTIVITAMIN ADULT  PO) Take by mouth. Enhanced energy formula from [facility] [address]    . omega-3 acid ethyl esters (LOVAZA) 1 g capsule Take by mouth 2 (two) times daily.    . phentermine 15 MG capsule Take 1 capsule (15 mg total) by mouth every morning. 30 capsule 1  . SUTAB [id number] MG TABS Take 12 tablets by mouth as directed. 24 tablet 0  . TRULICITY 0.75 MG/0.5ML SOPN INJECT 0.75 MG INTO THE SKIN ONCE A WEEK 12 mL 0   No current facility-administered medications on file prior to visit.    BP 132/70   Temp 98.3 F (36.8 C) (Oral)   Ht 6\' 1"  (1.854 m)   Wt 274 lb 4 oz (124.4 kg)   BMI 36.18 kg/m       Objective:   Physical Exam Vitals and nursing note reviewed.  Constitutional:      Appearance: Normal appearance.  Cardiovascular:     Rate and Rhythm: Normal rate and regular rhythm.     Pulses: Normal pulses.     Heart sounds: Normal heart sounds.  Musculoskeletal:        General: Normal range of motion.  Skin:    General: Skin is warm and dry.  Neurological:     General: No focal deficit present.     Mental Status: He is alert and oriented to person, place, and time.  Psychiatric:        Mood and Affect: Mood normal.        Behavior: Behavior normal.        Thought Content: Thought content normal.        Judgment: Judgment normal.       Assessment & Plan:  1. Class 3 severe obesity due to excess calories with serious comorbidity and body mass index (BMI) of 40.0 to 44.9 in adult (HCC) - Will increase Phentermine to 30 m  - Follow up in 3 months  - phentermine 30 MG capsule; Take 1 capsule (30 mg total) by mouth every morning.  Dispense: 30 capsule; Refill: 2  2. Controlled type 2 diabetes mellitus without complication, without long-term current use of insulin (HCC)  - POC HgB A1c- improved - Continue with Trulicity. Follow up in 3 months  Lab Results  Component Value Date   HGBA1C 5.7 08/07/2020     3. Essential hypertension - No change  - Controlled     08/09/2020, NP

## 2020-08-31 ENCOUNTER — Other Ambulatory Visit: Payer: Self-pay | Admitting: Adult Health

## 2020-08-31 DIAGNOSIS — E119 Type 2 diabetes mellitus without complications: Secondary | ICD-10-CM

## 2020-09-03 ENCOUNTER — Encounter: Payer: Self-pay | Admitting: Adult Health

## 2020-09-03 MED ORDER — TRULICITY 0.75 MG/0.5ML ~~LOC~~ SOAJ: 0.7500 mg | SUBCUTANEOUS | 0 refills | Status: DC

## 2020-09-04 ENCOUNTER — Telehealth: Payer: Self-pay

## 2020-09-04 NOTE — Telephone Encounter (Signed)
Fax received ok status. Will wait for approval.

## 2020-09-04 NOTE — Telephone Encounter (Signed)
Deborra Medina Key: [id number] - Rx #: [id number] Need help? Call us at [phone] Status New(Not sent to plan) Cannot find matching patient Drug Trulicity 0.75MG /0.5ML pen-injectors Form Passenger transport manager PA Form (2017 NCPDP) Original Claim Info 89   Cover My Meds could not process so I filled form out manually and faxed in forms.

## 2020-09-05 ENCOUNTER — Ambulatory Visit (AMBULATORY_SURGERY_CENTER): Payer: Managed Care, Other (non HMO) | Admitting: Gastroenterology

## 2020-09-05 ENCOUNTER — Other Ambulatory Visit: Payer: Self-pay

## 2020-09-05 ENCOUNTER — Encounter: Payer: Self-pay | Admitting: Gastroenterology

## 2020-09-05 VITALS — BP 119/71 | HR 87 | Temp 98.6°F | Resp 14 | Ht 73.0 in | Wt 275.0 lb

## 2020-09-05 DIAGNOSIS — Z1211 Encounter for screening for malignant neoplasm of colon: Secondary | ICD-10-CM | POA: Diagnosis not present

## 2020-09-05 MED ORDER — SODIUM CHLORIDE 0.9 % IV SOLN: 500.0000 mL | Freq: Once | INTRAVENOUS | Status: DC

## 2020-09-05 NOTE — Telephone Encounter (Signed)
Erroneous encounter

## 2020-09-05 NOTE — Patient Instructions (Signed)
Information on hemorrhoids given to you today. ° °Resume previous diet and medications. ° °Repeat colonoscopy in 10 years. ° ° °YOU HAD AN ENDOSCOPIC PROCEDURE TODAY AT THE  ENDOSCOPY CENTER:   Refer to the procedure report that was given to you for any specific questions about what was found during the examination.  If the procedure report does not answer your questions, please call your gastroenterologist to clarify.  If you requested that your care partner not be given the details of your procedure findings, then the procedure report has been included in a sealed envelope for you to review at your convenience later. ° °YOU SHOULD EXPECT: Some feelings of bloating in the abdomen. Passage of more gas than usual.  Walking can help get rid of the air that was put into your GI tract during the procedure and reduce the bloating. If you had a lower endoscopy (such as a colonoscopy or flexible sigmoidoscopy) you may notice spotting of blood in your stool or on the toilet paper. If you underwent a bowel prep for your procedure, you may not have a normal bowel movement for a few days. ° °Please Note:  You might notice some irritation and congestion in your nose or some drainage.  This is from the oxygen used during your procedure.  There is no need for concern and it should clear up in a day or so. ° °SYMPTOMS TO REPORT IMMEDIATELY: ° °Following lower endoscopy (colonoscopy or flexible sigmoidoscopy): ° Excessive amounts of blood in the stool ° Significant tenderness or worsening of abdominal pains ° Swelling of the abdomen that is new, acute ° Fever of 100°F or higher ° ° ° °For urgent or emergent issues, a gastroenterologist can be reached at any hour by calling [phone]. °Do not use MyChart messaging for urgent concerns.  ° ° °DIET:  We do recommend a small meal at first, but then you may proceed to your regular diet.  Drink plenty of fluids but you should avoid alcoholic beverages for 24  hours. ° °ACTIVITY:  You should plan to take it easy for the rest of today and you should NOT DRIVE or use heavy machinery until tomorrow (because of the sedation medicines used during the test).   ° °FOLLOW UP: °Our staff will call the number listed on your records 48-72 hours following your procedure to check on you and address any questions or concerns that you may have regarding the information given to you following your procedure. If we do not reach you, we will leave a message.  We will attempt to reach you two times.  During this call, we will ask if you have developed any symptoms of COVID 19. If you develop any symptoms (ie: fever, flu-like symptoms, shortness of breath, cough etc.) before then, please call [phone].  If you test positive for Covid 19 in the 2 weeks post procedure, please call and report this information to us.   ° °If any biopsies were taken you will be contacted by phone or by letter within the next 1-3 weeks.  Please call us at [phone] if you have not heard about the biopsies in 3 weeks.  ° ° °SIGNATURES/CONFIDENTIALITY: °You and/or your care partner have signed paperwork which will be entered into your electronic medical record.  These signatures attest to the fact that that the information above on your After Visit Summary has been reviewed and is understood.  Full responsibility of the confidentiality of this discharge information lies with you   and/or your care-partner.  °

## 2020-09-05 NOTE — Progress Notes (Signed)
A/ox3, pleased with MAC, report to RN 

## 2020-09-05 NOTE — Op Note (Signed)
[facility] Patient Name: Shawn Becker Procedure Date: 09/05/2020 2:30 PM MRN: [id number] Endoscopist: Viviann Spare P. Adela Lank , MD Age: 50 Referring MD:  Date of Birth: 02-09-1971 Gender: Male Account #: [id number] Procedure:                Colonoscopy Indications:              Screening for colorectal malignant neoplasm, This                            is the patient's first colonoscopy Medicines:                Monitored Anesthesia Care Procedure:                Pre-Anesthesia Assessment:                           - Prior to the procedure, a History and Physical                            was performed, and patient medications and                            allergies were reviewed. The patient's tolerance of                            previous anesthesia was also reviewed. The risks                            and benefits of the procedure and the sedation                            options and risks were discussed with the patient.                            All questions were answered, and informed consent                            was obtained. Prior Anticoagulants: The patient has                            taken no previous anticoagulant or antiplatelet                            agents. ASA Grade Assessment: II - A patient with                            mild systemic disease. After reviewing the risks                            and benefits, the patient was deemed in                            satisfactory condition to undergo the procedure.  After obtaining informed consent, the colonoscope                            was passed under direct vision. Throughout the                            procedure, the patient's blood pressure, pulse, and                            oxygen saturations were monitored continuously. The                            Olympus CF-HQ190L [id number]) Colonoscope was                            introduced  through the anus and advanced to the the                            cecum, identified by appendiceal orifice and                            ileocecal valve. The colonoscopy was performed                            without difficulty. The patient tolerated the                            procedure well. The quality of the bowel                            preparation was adequate. The ileocecal valve,                            appendiceal orifice, and rectum were photographed. Scope In: 2:38:27 PM Scope Out: 3:00:04 PM Scope Withdrawal Time: 0 hours 18 minutes 26 seconds  Total Procedure Duration: 0 hours 21 minutes 37 seconds  Findings:                 The perianal and digital rectal examinations were                            normal.                           Internal hemorrhoids were found during                            retroflexion. The hemorrhoids were small.                           The exam was otherwise without abnormality. Patient                            had a fair amount of residual liquid stool in the  colon which took some time to lavage to achieve                            adequate views. Complications:            No immediate complications. Estimated blood loss:                            None. Estimated Blood Loss:     Estimated blood loss: none. Impression:               - Internal hemorrhoids.                           - The examination was otherwise normal.                           - No polyps Recommendation:           - Patient has a contact number available for                            emergencies. The signs and symptoms of potential                            delayed complications were discussed with the                            patient. Return to normal activities tomorrow.                            Written discharge instructions were provided to the                            patient.                           - Resume  previous diet.                           - Continue present medications.                           - Repeat colonoscopy in 10 years for screening                            purposes. Viviann Spare P. Clemmie Buelna, MD 09/05/2020 3:03:49 PM This report has been signed electronically.

## 2020-09-05 NOTE — Progress Notes (Signed)
Pt's states no medical or surgical changes since previsit or office visit.   VS taken by SB 

## 2020-09-07 ENCOUNTER — Telehealth: Payer: Self-pay | Admitting: *Deleted

## 2020-09-07 NOTE — Telephone Encounter (Signed)
Have you developed a fever since your procedure? no  2.   Have you had an respiratory symptoms (SOB or cough) since your procedure? no  3.   Have you tested positive for COVID 19 since your procedure no  4.   Have you had any family members/close contacts diagnosed with the COVID 19 since your procedure?  no   If yes to any of these questions please route to Laverna Peace, RN and Karlton Lemon, RN  Follow up Call-  Call back number 09/05/2020  Post procedure Call Back phone  # [phone]  Permission to leave phone message Yes  Some recent data might be hidden     Patient questions:  Do you have a fever, pain , or abdominal swelling? No. Pain Score  0 *  Have you tolerated food without any problems? Yes.    Have you been able to return to your normal activities? Yes.    Do you have any questions about your discharge instructions: Diet   No. Medications  No. Follow up visit  No.  Do you have questions or concerns about your Care? No.  Actions: * If pain score is 4 or above: No action needed, pain <4.

## 2020-09-10 NOTE — Telephone Encounter (Signed)
Spoke to pt and advised that Cover My meds is flagging incomplete. Pt informed that I spoke with a representative who advised to call this week for further assistance. However, tried calling today but closed for holiday

## 2020-09-10 NOTE — Telephone Encounter (Signed)
Fax came in with same error. Unsure what the issue is. Tried calling Cover My Meds but they were closed for holiday.

## 2020-09-12 ENCOUNTER — Telehealth: Payer: Self-pay | Admitting: Adult Health

## 2020-09-12 NOTE — Telephone Encounter (Signed)
This patient is needing titers done to find out what vaccines that he needs to go back to college. He needs these done before September 21, 2020. Can I add him to your schedule for Friday?

## 2020-09-13 NOTE — Telephone Encounter (Signed)
Patient is scheduled   

## 2020-09-14 ENCOUNTER — Encounter: Payer: Self-pay | Admitting: Family Medicine

## 2020-09-14 ENCOUNTER — Ambulatory Visit (INDEPENDENT_AMBULATORY_CARE_PROVIDER_SITE_OTHER): Payer: Managed Care, Other (non HMO) | Admitting: Family Medicine

## 2020-09-14 ENCOUNTER — Other Ambulatory Visit: Payer: Self-pay

## 2020-09-14 VITALS — BP 144/90 | HR 104 | Temp 98.1°F | Wt 275.2 lb

## 2020-09-14 DIAGNOSIS — Z0184 Encounter for antibody response examination: Secondary | ICD-10-CM | POA: Diagnosis not present

## 2020-09-14 DIAGNOSIS — Z23 Encounter for immunization: Secondary | ICD-10-CM

## 2020-09-14 NOTE — Progress Notes (Signed)
Established Patient Office Visit  Subjective:  Patient ID: Shawn Becker, male    DOB: 1970-08-02  Age: 50 y.o. MRN: [id number]  CC:  Chief Complaint  Patient presents with   Labs Only    HPI Shawn Becker presents for request for lab work.  He is enrolling in [place] at [employer].  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  They are acquiring immunization records and he has very limited records.  Last tetanus is unknown.  He is fairly certain he has had varicella clinically and MMR vaccines previously but does not have any records.  Needs documentation.  He also is not sure if he has had hepatitis B vaccine.  He reportedly had skin test for TB recently which was negative.  No high risk factors for TB.  No recent cough.  Past Medical History:  Diagnosis Date   Allergy    pollen   Arthritis    Fatty liver    Hypertension    Hypothyroidism    Left ventricular hypertrophy    Pre-diabetes     No past surgical history on file.  Family History  Problem Relation Age of Onset   Diabetes Mother    Hypertension Mother    Mental illness Mother    Bipolar disorder Mother    Schizophrenia Mother    Obesity Mother    Diabetes Maternal Grandmother    Hypertension Maternal Grandmother    Cancer Maternal Grandfather        colon cancer   Colon cancer Maternal Grandfather    Colon polyps Maternal Grandfather    Hyperlipidemia Paternal Grandmother    Cancer Paternal Grandfather        prostate cancer   Alcoholism Father    Drug abuse Father    Colon polyps Maternal Uncle    Esophageal cancer Neg Hx    Rectal cancer Neg Hx    Stomach cancer Neg Hx     Social History   Socioeconomic History   Marital status: Married    Spouse name: Taneka   Number of children: Not on file   Years of education: Not on file   Highest education level: Not on file  Occupational History   Occupation: Pastor/Musician/Studeny  Tobacco Use   Smoking  status: Never   Smokeless tobacco: Never  Vaping Use   Vaping Use: Never used  Substance and Sexual Activity   Alcohol use: No    Alcohol/week: 0.0 standard drinks   Drug use: No   Sexual activity: Not on file  Other Topics Concern   Not on file  Social History Narrative   Doristine Bosworth    Married    4 children          He likes to ride his motorcycle.       Social Determinants of Health   Financial Resource Strain: Not on file  Food Insecurity: Not on file  Transportation Needs: Not on file  Physical Activity: Not on file  Stress: Not on file  Social Connections: Not on file  Intimate Partner Violence: Not on file    Outpatient Medications Prior to Visit  Medication Sig Dispense Refill   Cholecalciferol (VITAMIN D-3) 125 MCG (5000 UT) TABS Take by mouth.     Dulaglutide (TRULICITY) 4.19 QQ/2.2LN SOPN Inject 0.75 mg into the skin once a week. 12 mL 0   EUTHYROX 50 MCG tablet TAKE 1 TABLET BY MOUTH ONCE DAILY BEFORE BREAKFAST 90 tablet 3  losartan-hydrochlorothiazide (HYZAAR) 100-12.5 MG tablet Take 1 tablet by mouth once daily 90 tablet 3   Multiple Vitamin (MULTIVITAMIN ADULT PO) Take by mouth. Enhanced energy formula from [facility] [address]     omega-3 acid ethyl esters (LOVAZA) 1 g capsule Take by mouth 2 (two) times daily.     phentermine 30 MG capsule Take 1 capsule (30 mg total) by mouth every morning. 30 capsule 2   Turmeric (QC TUMERIC COMPLEX PO) Take by mouth.     No facility-administered medications prior to visit.    Allergies  Allergen Reactions   Penicillins    Shellfish Allergy     ROS Review of Systems  Constitutional:  Negative for chills and fever.  Respiratory:  Negative for cough.   Skin:  Negative for rash.     Objective:    Physical Exam Vitals reviewed.  Constitutional:      Appearance: Normal appearance.  Cardiovascular:     Rate and Rhythm: Normal rate and regular rhythm.  Neurological:     Mental Status: He is alert.     BP (!) 144/90 (BP Location: Left Arm, Patient Position: Sitting, Cuff Size: Normal)   Pulse (!) 104   Temp 98.1 F (36.7 C) (Oral)   Wt 275 lb 3.2 oz (124.8 kg)   SpO2 98%   BMI 36.31 kg/m  Wt Readings from Last 3 Encounters:  09/14/20 275 lb 3.2 oz (124.8 kg)  09/05/20 275 lb (124.7 kg)  08/07/20 274 lb 4 oz (124.4 kg)     Health Maintenance Due  Topic Date Due   PNEUMOCOCCAL POLYSACCHARIDE VACCINE AGE 5-64 HIGH RISK  Never done   Hepatitis C Screening  Never done   COVID-19 Vaccine (3 - Booster for Pfizer series) 12/11/2019    There are no preventive care reminders to display for this patient.  Lab Results  Component Value Date   TSH 3.25 05/08/2020   Lab Results  Component Value Date   WBC 3.2 (L) 05/08/2020   HGB 13.7 05/08/2020   HCT 41.6 05/08/2020   MCV 79.1 05/08/2020   PLT 232.0 05/08/2020   Lab Results  Component Value Date   NA 138 05/08/2020   K 4.1 05/08/2020   CO2 31 05/08/2020   GLUCOSE 125 (H) 05/08/2020   BUN 12 05/08/2020   CREATININE 1.41 05/08/2020   BILITOT 0.4 05/08/2020   ALKPHOS 73 05/08/2020   AST 26 05/08/2020   ALT 35 05/08/2020   PROT 7.4 05/08/2020   ALBUMIN 4.1 05/08/2020   CALCIUM 9.1 05/08/2020   GFR 59.49 (L) 05/08/2020   Lab Results  Component Value Date   CHOL 143 05/08/2020   Lab Results  Component Value Date   HDL 29.90 (L) 05/08/2020   Lab Results  Component Value Date   LDLCALC 82 01/06/2019   Lab Results  Component Value Date   TRIG 367.0 (H) 05/08/2020   Lab Results  Component Value Date   CHOLHDL 5 05/08/2020   Lab Results  Component Value Date   HGBA1C 5.7 08/07/2020      Assessment & Plan:   Problem List Items Addressed This Visit   None Visit Diagnoses     Immunity status testing    -  Primary   Relevant Orders   Measles/Mumps/Rubella Immunity   Varicella Zoster Antibody, IgG   Hep B Surface Antibody   Need for Tdap vaccination       Relevant Orders   Tdap vaccine greater  than  or equal to 7yo IM (Completed)     Patient basically has no records of prior childhood vaccines.  In order to meet requirements for his school will obtain the following  -Varicella IgG antibody -Measles mumps rubella antibody -Tdap given -No indication for meningitis vaccine given his age -He has had COVID vaccines with 1 booster  No orders of the defined types were placed in this encounter.   Follow-up: No follow-ups on file.    Carolann Littler, MD

## 2020-09-17 LAB — MEASLES/MUMPS/RUBELLA IMMUNITY
Mumps IgG: 81.7 AU/mL
Rubella: <0.9 Index — ABNORMAL LOW
Rubeola IgG: >300 AU/mL

## 2020-09-17 LAB — VARICELLA ZOSTER ANTIBODY, IGG: Varicella IgG: 1407 index

## 2020-09-17 LAB — HEPATITIS B SURFACE ANTIBODY,QUALITATIVE: Hep B S Ab: NONREACTIVE

## 2020-09-17 NOTE — Telephone Encounter (Signed)
Received PPW for [employer] that stated Trulicity was approved. PPW placed in scans.

## 2020-09-17 NOTE — Telephone Encounter (Signed)
Called again after faxing PPW manually. I received another Cover My Meds code to do the PA. Called Cover My Meds and they stated pt is not in their system and may not be covered under cover my meds. I verbalized understanding and stated I will call pt for understanding.

## 2020-09-17 NOTE — Telephone Encounter (Signed)
Called pt to see if he heard that the Rx was approved. Pt stated that he received his Rx last week. Pt stated that his spouse changed jobs and the insurance changed. Pt stated that his spouse called to get Rx approved. Pt new insurance card is on file.

## 2020-09-19 ENCOUNTER — Ambulatory Visit: Payer: Managed Care, Other (non HMO)

## 2020-09-19 ENCOUNTER — Other Ambulatory Visit: Payer: Self-pay

## 2020-09-19 ENCOUNTER — Telehealth: Payer: Self-pay | Admitting: Adult Health

## 2020-09-19 DIAGNOSIS — Z23 Encounter for immunization: Secondary | ICD-10-CM

## 2020-09-19 NOTE — Progress Notes (Signed)
Per orders of Dorothyann Peng NP, injection of MMR given by Franco Collet. Patient tolerated injection well.

## 2020-09-19 NOTE — Telephone Encounter (Signed)
Left message for patient to call back  

## 2020-09-19 NOTE — Telephone Encounter (Signed)
Pt is calling in to check the status of his form that he left with the provider when he was in the office.

## 2020-09-20 ENCOUNTER — Encounter: Payer: Self-pay | Admitting: Adult Health

## 2020-09-20 ENCOUNTER — Ambulatory Visit (INDEPENDENT_AMBULATORY_CARE_PROVIDER_SITE_OTHER): Payer: Managed Care, Other (non HMO) | Admitting: Adult Health

## 2020-09-20 VITALS — BP 128/88 | HR 64 | Temp 99.4°F | Ht 73.0 in | Wt 269.0 lb

## 2020-09-20 DIAGNOSIS — R8290 Unspecified abnormal findings in urine: Secondary | ICD-10-CM

## 2020-09-20 LAB — POCT URINALYSIS DIPSTICK
Bilirubin, UA: NEGATIVE
Blood, UA: NEGATIVE
Glucose, UA: NEGATIVE
Ketones, UA: NEGATIVE
Leukocytes, UA: NEGATIVE
Nitrite, UA: NEGATIVE
Protein, UA: NEGATIVE
Spec Grav, UA: 1.02 (ref 1.010–1.025)
Urobilinogen, UA: 0.2 E.U./dL
pH, UA: 6 (ref 5.0–8.0)

## 2020-09-20 NOTE — Telephone Encounter (Signed)
The patient came into the office yesterday for a nurse visit. He was given his forms at that time by [place]. Nothing further needed.

## 2020-09-20 NOTE — Patient Instructions (Signed)
Your urinalysis does not show that you have a UTI. Likely the odor is coming from multivitamins and a cleaner diet.

## 2020-09-20 NOTE — Progress Notes (Signed)
Subjective:    Patient ID: Shawn Becker, male    DOB: [birth year], 50 y.o.   MRN: [id number]  HPI  50 year old male who  has a past medical history of Allergy, Arthritis, Fatty liver, Hypertension, Hypothyroidism, Left ventricular hypertrophy, and Pre-diabetes.  He presents to the office today for an acute issue of odorous urine.  Per patient report, his wife wanted him to get checked out because she has noticed that he has odorous urine.  He has no signs of a UTI and is not concerned for an STD.  Has been trying to eat healthier and has started taking a multivitamin which she believes is causing his urine to be slightly odorous.   Review of Systems See HPI   Past Medical History:  Diagnosis Date   Allergy    pollen   Arthritis    Fatty liver    Hypertension    Hypothyroidism    Left ventricular hypertrophy    Pre-diabetes     Social History   Socioeconomic History   Marital status: Married    Spouse name: Taneka   Number of children: Not on file   Years of education: Not on file   Highest education level: Not on file  Occupational History   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       Social Determinants of Health   Financial Resource Strain: Not on file  Food Insecurity: Not on file  Transportation Needs: Not on file  Physical Activity: Not on file  Stress: Not on file  Social Connections: Not on file  Intimate Partner Violence: Not on file    History reviewed. No pertinent surgical history.  Family History  Problem Relation Age of Onset   Diabetes Mother     Hypertension Mother    Mental illness Mother    Bipolar disorder Mother    Schizophrenia Mother    Obesity Mother    Diabetes Maternal Grandmother    Hypertension Maternal Grandmother    Cancer Maternal Grandfather        colon cancer   Colon cancer Maternal Grandfather    Colon polyps Maternal Grandfather    Hyperlipidemia Paternal Grandmother    Cancer Paternal Grandfather        prostate cancer   Alcoholism Father    Drug abuse Father    Colon polyps Maternal Uncle    Esophageal cancer Neg Hx    Rectal cancer Neg Hx    Stomach cancer Neg Hx     Allergies  Allergen Reactions   Penicillins    Shellfish Allergy     Current Outpatient Medications on File Prior to Visit  Medication Sig Dispense Refill   Cholecalciferol (VITAMIN D-3) 125 MCG (5000 UT) TABS Take by mouth.     Dulaglutide (TRULICITY) 0.75 MG/0.5ML SOPN Inject 0.75 mg into the skin once a week. 12 mL 0   EUTHYROX 50 MCG tablet TAKE 1 TABLET BY MOUTH ONCE DAILY BEFORE BREAKFAST 90 tablet  3   losartan-hydrochlorothiazide (HYZAAR) 100-12.5 MG tablet Take 1 tablet by mouth once daily 90 tablet 3   Multiple Vitamin (MULTIVITAMIN ADULT PO) Take by mouth. Enhanced energy formula from [facility] [address]     omega-3 acid ethyl esters (LOVAZA) 1 g capsule Take by mouth 2 (two) times daily.     phentermine 30 MG capsule Take 1 capsule (30 mg total) by mouth every morning. 30 capsule 2   Turmeric (QC TUMERIC COMPLEX PO) Take by mouth.     No current facility-administered medications on file prior to visit.    BP 128/88   Pulse 64   Temp 99.4 F (37.4 C) (Oral)   Ht 6\' 1"  (1.854 m)   Wt 269 lb (122 kg)   SpO2 96%   BMI 35.49 kg/m       Objective:   Physical Exam Vitals and nursing note reviewed.  Constitutional:      Appearance: Normal appearance.  Abdominal:     General: Abdomen is flat.     Palpations: Abdomen is soft.  Musculoskeletal:        General: Normal range of motion.  Skin:     General: Skin is warm and dry.     Capillary Refill: Capillary refill takes less than 2 seconds.  Neurological:     General: No focal deficit present.     Mental Status: He is alert and oriented to person, place, and time.  Psychiatric:        Mood and Affect: Mood normal.        Behavior: Behavior normal.        Thought Content: Thought content normal.        Judgment: Judgment normal.      Assessment & Plan:   1. Foul smelling urine -Urinalysis negative.  Completely asymptomatic.  Likely from multivitamin. - POC Urinalysis Dipstick  , NP

## 2020-09-26 ENCOUNTER — Telehealth: Payer: Self-pay | Admitting: Adult Health

## 2020-09-26 NOTE — Telephone Encounter (Signed)
Pt is calling in needing to have 2 vaccines Tdap (booster) and DTP (3 series) and would like to schedule it as soon as possible.

## 2020-09-26 NOTE — Telephone Encounter (Signed)
Pt called the office back w/further questions about the vaccines

## 2020-09-26 NOTE — Telephone Encounter (Signed)
Pt notified that he already received the Tdap vaccine and per Kandee Keen this should cover the Dtp vaccine. Pt notified of update.

## 2020-09-27 NOTE — Telephone Encounter (Signed)
Left message to return phone call.

## 2020-09-27 NOTE — Telephone Encounter (Signed)
[  8:57 AM] Shawn Becker, Shawn Becker MRN [id number] is returning your call  [9:00 AM] Shawn Becker I told him that you were in a room with a patient and that you would call him back

## 2020-09-27 NOTE — Telephone Encounter (Signed)
Called pt to advise how to find immunization on Mychart. No further action needed.

## 2020-10-17 ENCOUNTER — Ambulatory Visit: Payer: Managed Care, Other (non HMO)

## 2020-10-18 ENCOUNTER — Ambulatory Visit (INDEPENDENT_AMBULATORY_CARE_PROVIDER_SITE_OTHER): Payer: Managed Care, Other (non HMO)

## 2020-10-18 ENCOUNTER — Other Ambulatory Visit: Payer: Self-pay

## 2020-10-18 DIAGNOSIS — Z23 Encounter for immunization: Secondary | ICD-10-CM

## 2020-10-18 NOTE — Progress Notes (Signed)
Per orders of Dr. Dorothyann Peng, injection of Tdap and MMR given by Franco Collet. Patient tolerated injection well.    MMR given instead of 2n dose of MMRV due to wrong vaccine given 09/14/2020 of MMRV.

## 2020-10-30 ENCOUNTER — Other Ambulatory Visit: Payer: Self-pay

## 2020-10-30 ENCOUNTER — Encounter: Payer: Self-pay | Admitting: Adult Health

## 2020-10-30 ENCOUNTER — Ambulatory Visit (INDEPENDENT_AMBULATORY_CARE_PROVIDER_SITE_OTHER): Payer: Managed Care, Other (non HMO) | Admitting: Adult Health

## 2020-10-30 VITALS — BP 138/90 | HR 95 | Temp 98.6°F | Ht 73.0 in | Wt 279.0 lb

## 2020-10-30 DIAGNOSIS — E119 Type 2 diabetes mellitus without complications: Secondary | ICD-10-CM | POA: Diagnosis not present

## 2020-10-30 DIAGNOSIS — I10 Essential (primary) hypertension: Secondary | ICD-10-CM | POA: Diagnosis not present

## 2020-10-30 DIAGNOSIS — Z6841 Body Mass Index (BMI) 40.0 and over, adult: Secondary | ICD-10-CM | POA: Diagnosis not present

## 2020-10-30 LAB — POCT GLYCOSYLATED HEMOGLOBIN (HGB A1C): Hemoglobin A1C: 5.8 % — AB (ref 4.0–5.6)

## 2020-10-30 MED ORDER — PHENTERMINE HCL 30 MG PO CAPS: 30.0000 mg | ORAL_CAPSULE | ORAL | 2 refills | Status: DC

## 2020-10-30 MED ORDER — TRULICITY 1.5 MG/0.5ML ~~LOC~~ SOAJ: 1.5000 mg | SUBCUTANEOUS | 0 refills | Status: AC

## 2020-10-30 NOTE — Progress Notes (Signed)
Subjective:    Patient ID: Shawn Becker, male    DOB: 13-Aug-1970, 50 y.o.   MRN: [id number]  HPI 50 year old male who  has a past medical history of Allergy, Arthritis, Fatty liver, Hypertension, Hypothyroidism, Left ventricular hypertrophy, and Pre-diabetes.  He presents to the office today for three month follow up regarding DM, HTN, and Obesity   DM -diagnosed in February 2022.  He was originally started on Trulicity and metformin but metformin was stopped due to joint pain.  Tolerated Trulicity well and had not experienced any GI issues.  He is currently on Trulicity 0.75 mg weekly.  He has cut back on carbs and sugars Lab Results  Component Value Date   HGBA1C 5.7 08/07/2020   HTN -well controlled with Hyzaar 100-12.5 mg daily.  He denies dizziness, lightheadedness, chest pain, shortness of breath BP Readings from Last 3 Encounters:  10/30/20 138/90  09/20/20 128/88  09/14/20 (!) 144/90   Obesity -currently prescribed phentermine 30 mg daily.  Has been tolerating this medication well.  Denies side effects such as tachycardia, insomnia, or headaches. He has lost about 30 pounds since he started on his weight loss journey. He has been able to maintain his weight loss when he is on phentermine but since he has run out he has gained about 10 pounds. He is also interested in getting information on weight loss surgery   Wt Readings from Last 10 Encounters:  10/30/20 279 lb (126.6 kg)  09/20/20 269 lb (122 kg)  09/14/20 275 lb 3.2 oz (124.8 kg)  09/05/20 275 lb (124.7 kg)  08/07/20 274 lb 4 oz (124.4 kg)  07/09/20 275 lb (124.7 kg)  06/15/20 286 lb 9.6 oz (130 kg)  05/08/20 (!) 309 lb (140.2 kg)  03/20/20 (!) 311 lb 9.6 oz (141.3 kg)  02/10/20 (!) 303 lb 3.2 oz (137.5 kg)   Review of Systems See HPI   Past Medical History:  Diagnosis Date   Allergy    pollen   Arthritis    Fatty liver    Hypertension    Hypothyroidism    Left ventricular hypertrophy     Pre-diabetes     Social History   Socioeconomic History   Marital status: Married    Spouse name: Taneka   Number of children: Not on file   Years of education: Not on file   Highest education level: Not on file  Occupational History   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       Social Determinants of Health   Financial Resource Strain: Not on file  Food Insecurity: Not on file  Transportation Needs: Not on file  Physical Activity: Not on file  Stress: Not on file  Social Connections: Not on file  Intimate Partner Violence: Not on file    History reviewed. No pertinent surgical history.  Family History  Problem Relation Age of Onset   Diabetes Mother    Hypertension Mother    Mental illness  Mother    Bipolar disorder Mother    Schizophrenia Mother    Obesity Mother    Diabetes Maternal Grandmother    Hypertension Maternal Grandmother    Cancer Maternal Grandfather        colon cancer   Colon cancer Maternal Grandfather    Colon polyps Maternal Grandfather    Hyperlipidemia Paternal Grandmother    Cancer Paternal Grandfather        prostate cancer   Alcoholism Father    Drug abuse Father    Colon polyps Maternal Uncle    Esophageal cancer Neg Hx    Rectal cancer Neg Hx    Stomach cancer Neg Hx     Allergies  Allergen Reactions   Penicillins    Shellfish Allergy     Current Outpatient Medications on File Prior to Visit  Medication Sig Dispense Refill   Cholecalciferol (VITAMIN D-3) 125 MCG (5000 UT) TABS Take by mouth.     EUTHYROX 50 MCG tablet TAKE 1 TABLET BY MOUTH ONCE  DAILY BEFORE BREAKFAST 90 tablet 3   losartan-hydrochlorothiazide (HYZAAR) 100-12.5 MG tablet Take 1 tablet by mouth once daily 90 tablet 3   Multiple Vitamin (MULTIVITAMIN ADULT PO) Take by mouth. Enhanced energy formula from [facility] [address]     omega-3 acid ethyl esters (LOVAZA) 1 g capsule Take by mouth 2 (two) times daily.     Turmeric (QC TUMERIC COMPLEX PO) Take by mouth.     No current facility-administered medications on file prior to visit.    BP 138/90   Pulse 95   Temp 98.6 F (37 C) (Oral)   Ht 6\' 1"  (1.854 m)   Wt 279 lb (126.6 kg)   SpO2 98%   BMI 36.81 kg/m       Objective:   Physical Exam Vitals and nursing note reviewed.  Constitutional:      Appearance: Normal appearance.  Cardiovascular:     Rate and Rhythm: Normal rate and regular rhythm.     Pulses: Normal pulses.     Heart sounds: Normal heart sounds.  Pulmonary:     Effort: Pulmonary effort is normal.     Breath sounds: Normal breath sounds.  Musculoskeletal:        General: Normal range of motion.  Skin:    General: Skin is warm and dry.     Capillary Refill: Capillary refill takes less than 2 seconds.  Neurological:     General: No focal deficit present.     Mental Status: He is alert and oriented to person, place, and time.  Psychiatric:        Mood and Affect: Mood normal.        Behavior: Behavior normal.        Thought Content: Thought content normal.      Assessment & Plan:   1. Class 3 severe obesity due to excess calories with serious comorbidity and body mass index (BMI) of 40.0 to 44.9 in adult (HCC)  - phentermine 30 MG capsule; Take 1 capsule (30 mg total) by mouth every morning.  Dispense: 30 capsule; Refill: 2 - Amb Referral to Bariatric Surgery - Follow up in 3 months   2. Controlled type 2 diabetes mellitus without complication, without long-term current use of insulin (HCC) - Will increase Trulicity to 1.5 mg  - Follow up in three months  - Dulaglutide  (TRULICITY) 1.5 MG/0.5ML SOPN; Inject 1.5 mg into the skin once a week.  Dispense: 6  mL; Refill: 0 - POC HgB A1c- 5.8   3. Essential hypertension - Controlled. No change in medications    Shirline Frees, NP

## 2020-10-30 NOTE — Patient Instructions (Addendum)
I have increased your Trulicity to 1.5 mg weekly and refilled your phentermine   I will see you back in 3 months   I have referred you to Bariatric surgery but you can go to the [employer] [facility] surgery website and register for a seminar

## 2020-11-12 ENCOUNTER — Telehealth: Payer: Self-pay

## 2020-11-12 NOTE — Telephone Encounter (Signed)
Please click to access Prescribing Information, including Boxed Warning about possible thyroid tumors including thyroid cancer, and Medication Guide.  Please see Instructions for Use included with the pen.  For Important Safety Information click here.   Trulicity and its delivery device base are registered trademarks owned or licensed by [employer], [employer], or affiliates.  [id number] 07/2019 [employer] [place], [place] [address]45. All rights reserved. ? There was an error with your request  ? Cannot find matching patient  ? Prescriber Instructions This is a Architect form (ePA). Complete the fields below, then click the "Send to Plan" button to submit. ? Patient Did you mean: Deborra Medina? Clear Name Prefix First Cristal [address] [facility] Address [address] [place] Zip [address] Date of Birth 08-28-1970 Gender  Male  Male Phone Member ID Member ID will begin with U, followed by 10 digits (ex: [id number]) or begin with 9 digits, followed by a person code (ex: [id number]). A person code is most often 00, 01, 02, or 03. [id number] ? Pharmacy Info (Optional)

## 2020-11-14 NOTE — Telephone Encounter (Signed)
Called pt to see if insurance has changed. Pt stated that he will check with spouse to make sure everything is still the same. Will await for pt to return call.

## 2020-11-19 NOTE — Telephone Encounter (Signed)
Left message to return phone call.

## 2020-11-20 NOTE — Telephone Encounter (Signed)
Spoke to pt spouse and she stated that they are updating insurance information now. Pt spouse will call back and let me know when everything is set with insurance for pt medication. Per spouse pt will try to pay put of pocket for Rx. No further action needed!

## 2021-01-14 ENCOUNTER — Telehealth: Payer: Self-pay | Admitting: Adult Health

## 2021-01-14 NOTE — Telephone Encounter (Signed)
Pharmacy called because they are able to fill EUTHYROX 50 MCG tablet but it will be made by a different company. They need the okay to fill it     Good callback number is [phone]      Please advise

## 2021-01-15 NOTE — Telephone Encounter (Signed)
Ok to fill medication that will be made by a different company?

## 2021-01-15 NOTE — Telephone Encounter (Signed)
Pharmacist advised of update

## 2021-01-28 ENCOUNTER — Encounter: Payer: Self-pay | Admitting: Family Medicine

## 2021-01-28 ENCOUNTER — Telehealth: Payer: Self-pay | Admitting: Adult Health

## 2021-01-28 ENCOUNTER — Telehealth (INDEPENDENT_AMBULATORY_CARE_PROVIDER_SITE_OTHER): Payer: Managed Care, Other (non HMO) | Admitting: Family Medicine

## 2021-01-28 DIAGNOSIS — J069 Acute upper respiratory infection, unspecified: Secondary | ICD-10-CM

## 2021-01-28 LAB — POC COVID19 BINAXNOW: SARS Coronavirus 2 Ag: NEGATIVE

## 2021-01-28 LAB — POCT INFLUENZA A/B
Influenza A, POC: NEGATIVE
Influenza B, POC: NEGATIVE

## 2021-01-28 NOTE — Progress Notes (Signed)
Virtual Visit via Video Note  I connected with Deborra Medina on 01/28/21 at  1:30 PM EST by a video enabled telemedicine application 2/2 COVID-19 pandemic and verified that I am speaking with the correct person using two identifiers.  Location patient: home Location provider:work or home office Persons participating in the virtual visit: patient, provider  I discussed the limitations of evaluation and management by telemedicine and the availability of in person appointments. The patient expressed understanding and agreed to proceed.  Chief Complaint  Patient presents with   Nasal Congestion    Nasal congestion , sore throat, light HA, lots of sneezing that leads to pain when body constricts, and just feels miserable. Symptoms started on friday     HPI: Pt with nasal congestion, rhinorrhea, mild sore throat, light HA, sneezing fits that cause discomfort x 4 days.  Intermittent cough from throat irritation.  Mild R ear with slight pressure.  Pt denies n/v, sick contacts.  Taking zyrtec nightly.  Tried dayquil and coricidin.     Pt is a Consulting civil engineer at [employer], concerned about sneezing episodes.  ROS: See pertinent positives and negatives per HPI.  Past Medical History:  Diagnosis Date   Allergy    pollen   Arthritis    Fatty liver    Hypertension    Hypothyroidism    Left ventricular hypertrophy    Pre-diabetes     No past surgical history on file.  Family History  Problem Relation Age of Onset   Diabetes Mother    Hypertension Mother    Mental illness Mother    Bipolar disorder Mother    Schizophrenia Mother    Obesity Mother    Diabetes Maternal Grandmother    Hypertension Maternal Grandmother    Cancer Maternal Grandfather        colon cancer   Colon cancer Maternal Grandfather    Colon polyps Maternal Grandfather    Hyperlipidemia Paternal Grandmother    Cancer Paternal Grandfather        prostate cancer   Alcoholism Father    Drug abuse Father    Colon polyps  Maternal Uncle    Esophageal cancer Neg Hx    Rectal cancer Neg Hx    Stomach cancer Neg Hx      Current Outpatient Medications:    Cholecalciferol (VITAMIN D-3) 125 MCG (5000 UT) TABS, Take by mouth., Disp: , Rfl:    Dulaglutide (TRULICITY) 1.5 MG/0.5ML SOPN, Inject 1.5 mg into the skin once a week., Disp: 6 mL, Rfl: 0   EUTHYROX 50 MCG tablet, TAKE 1 TABLET BY MOUTH ONCE DAILY BEFORE BREAKFAST, Disp: 90 tablet, Rfl: 3   losartan-hydrochlorothiazide (HYZAAR) 100-12.5 MG tablet, Take 1 tablet by mouth once daily, Disp: 90 tablet, Rfl: 3   Multiple Vitamin (MULTIVITAMIN ADULT PO), Take by mouth. Enhanced energy formula from [facility] [address], Disp: , Rfl:    omega-3 acid ethyl esters (LOVAZA) 1 g capsule, Take by mouth 2 (two) times daily., Disp: , Rfl:    phentermine 30 MG capsule, Take 1 capsule (30 mg total) by mouth every morning., Disp: 30 capsule, Rfl: 2   Turmeric (QC TUMERIC COMPLEX PO), Take by mouth., Disp: , Rfl:   EXAM:  VITALS per patient if applicable: RR between 12-20 bpm  GENERAL: alert, oriented, appears well and in no acute distress  HEENT: atraumatic, conjunctiva clear, no obvious abnormalities on inspection of external nose and ears  NECK: normal movements of the head and neck  LUNGS: on inspection no signs of respiratory distress, breathing rate appears normal, no obvious gross SOB, gasping or wheezing  CV: no obvious cyanosis  MS: moves all visible extremities without noticeable abnormality  PSYCH/NEURO: pleasant and cooperative, no obvious depression or anxiety, speech and thought processing grossly intact  ASSESSMENT AND PLAN:  Discussed the following assessment and plan:  Viral URI -Influenza and COVID testing negative in clinic -Discussed symptoms likely 2/2 acute nasopharyngitis -Supportive care including rest, hydration, gargling with warm salt water or Chloraseptic spray, OTC cough/cold medications -Also discussed consistent use of  Flonase and switching from Zyrtec to a different antihistamine -Given precautions  Follow-up as needed   I discussed the assessment and treatment plan with the patient. The patient was provided an opportunity to ask questions and all were answered. The patient agreed with the plan and demonstrated an understanding of the instructions.   The patient was advised to call back or seek an in-person evaluation if the symptoms worsen or if the condition fails to improve as anticipated.   Deeann Saint, MD

## 2021-01-28 NOTE — Telephone Encounter (Signed)
Patient calling in with respiratory symptoms: Shortness of breath, chest pain, palpitations or other red words send to Triage  Does the patient have a fever over 100, cough, congestion, sore throat, runny nose, lost of taste/smell within the last 5 days?    Yes: "have you tested for Covid?" [x]    No, patient has not tested for covid: If no [x]  schedule patient for an in person visit. Let them know "you will have to arrive prior to your appt time to be Covid tested. Please park in back of office at the [facility] & call [phone] to let the staff know you have arrived. A staff member will meet you at your car to do a rapid covid test. Once the test has resulted you will be notified by phone of your results to determine if appt will remain an in person visit or be converted to a virtual/phone visit."   THINGS TO REMEMBER  If no availability for virtual visit in office,  please schedule another [place] office  If no availability at another [place] office, please instruct patient that they can schedule an evisit or virtual visit through their mychart account. Visits up to 8pm  patients can be seen in office 5 days after positive COVID test

## 2021-03-28 ENCOUNTER — Encounter: Payer: Self-pay | Admitting: Adult Health

## 2021-03-28 NOTE — Telephone Encounter (Signed)
Please advise 

## 2021-03-29 ENCOUNTER — Other Ambulatory Visit: Payer: Self-pay | Admitting: Adult Health

## 2021-03-29 MED ORDER — LOSARTAN POTASSIUM 100 MG PO TABS: 100.0000 mg | ORAL_TABLET | Freq: Every day | ORAL | 0 refills | Status: DC

## 2021-03-29 NOTE — Telephone Encounter (Signed)
FYI

## 2021-04-01 ENCOUNTER — Telehealth: Payer: Self-pay | Admitting: *Deleted

## 2021-04-01 ENCOUNTER — Ambulatory Visit: Payer: Managed Care, Other (non HMO)

## 2021-04-01 NOTE — Telephone Encounter (Signed)
Patient was on the nurse's visit schedule today at 2pm for a third tetanus vaccine.  I reviewed the chart and did not see a note stating this was previously approved by the PCP.  I spoke with Dr Caryl Never and he stated he has never heard of this being required for an adult and this would need to await approval from the PCP.  Spoke with the patient to inform him of this, appt was cancelled and message was sent to PCP to contact the patient via cell number of [phone].

## 2021-04-05 NOTE — Telephone Encounter (Signed)
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 Form stated that since pt never received Dtap as a child he will have to get 3 Tdap vaccines 33m 33months then 1months. Pt received the first 2 here. I called pt back to advise to bring paper in as I forgot to make copies. Pt stated that he will try to find copies or send the schools information to have them fax it over. Pt also stated that the school is willing to just give him the Tdap.

## 2021-05-03 ENCOUNTER — Other Ambulatory Visit: Payer: Self-pay | Admitting: Adult Health

## 2021-05-23 ENCOUNTER — Other Ambulatory Visit: Payer: Self-pay | Admitting: Adult Health

## 2021-05-23 DIAGNOSIS — E063 Autoimmune thyroiditis: Secondary | ICD-10-CM

## 2021-05-23 DIAGNOSIS — Z0000 Encounter for general adult medical examination without abnormal findings: Secondary | ICD-10-CM

## 2021-06-04 ENCOUNTER — Other Ambulatory Visit: Payer: Self-pay | Admitting: Adult Health

## 2021-06-27 ENCOUNTER — Other Ambulatory Visit: Payer: Self-pay | Admitting: Adult Health

## 2021-06-27 DIAGNOSIS — E063 Autoimmune thyroiditis: Secondary | ICD-10-CM

## 2021-06-27 DIAGNOSIS — Z0000 Encounter for general adult medical examination without abnormal findings: Secondary | ICD-10-CM

## 2021-06-27 NOTE — Telephone Encounter (Signed)
Rx refilled for 30 days. Pt needs to schedule a CPE for further refills. ?

## 2021-07-01 ENCOUNTER — Other Ambulatory Visit: Payer: Self-pay | Admitting: Adult Health

## 2021-07-29 ENCOUNTER — Other Ambulatory Visit: Payer: Self-pay | Admitting: Adult Health

## 2021-07-29 DIAGNOSIS — E063 Autoimmune thyroiditis: Secondary | ICD-10-CM

## 2021-07-29 DIAGNOSIS — Z0000 Encounter for general adult medical examination without abnormal findings: Secondary | ICD-10-CM

## 2021-07-30 NOTE — Telephone Encounter (Signed)
Patient need to schedule an ov for more refills. 

## 2021-08-02 ENCOUNTER — Other Ambulatory Visit: Payer: Self-pay | Admitting: Adult Health

## 2021-08-02 DIAGNOSIS — E063 Autoimmune thyroiditis: Secondary | ICD-10-CM

## 2021-08-02 DIAGNOSIS — Z0000 Encounter for general adult medical examination without abnormal findings: Secondary | ICD-10-CM

## 2021-08-02 MED ORDER — LOSARTAN POTASSIUM 100 MG PO TABS: 100.0000 mg | ORAL_TABLET | Freq: Every day | ORAL | 0 refills | Status: DC

## 2021-08-02 MED ORDER — LEVOTHYROXINE SODIUM 50 MCG PO TABS: 50.0000 ug | ORAL_TABLET | Freq: Every day | ORAL | 0 refills | Status: DC

## 2021-09-04 ENCOUNTER — Telehealth: Payer: Self-pay | Admitting: Adult Health

## 2021-09-04 ENCOUNTER — Ambulatory Visit (INDEPENDENT_AMBULATORY_CARE_PROVIDER_SITE_OTHER): Payer: Self-pay | Admitting: Adult Health

## 2021-09-04 ENCOUNTER — Encounter: Payer: Self-pay | Admitting: Adult Health

## 2021-09-04 VITALS — BP 138/86 | HR 97 | Temp 98.3°F | Ht 72.0 in | Wt 267.0 lb

## 2021-09-04 DIAGNOSIS — E063 Autoimmune thyroiditis: Secondary | ICD-10-CM

## 2021-09-04 DIAGNOSIS — I10 Essential (primary) hypertension: Secondary | ICD-10-CM

## 2021-09-04 DIAGNOSIS — Z1159 Encounter for screening for other viral diseases: Secondary | ICD-10-CM

## 2021-09-04 DIAGNOSIS — E781 Pure hyperglyceridemia: Secondary | ICD-10-CM

## 2021-09-04 DIAGNOSIS — E119 Type 2 diabetes mellitus without complications: Secondary | ICD-10-CM

## 2021-09-04 DIAGNOSIS — Z125 Encounter for screening for malignant neoplasm of prostate: Secondary | ICD-10-CM

## 2021-09-04 DIAGNOSIS — Z0001 Encounter for general adult medical examination with abnormal findings: Secondary | ICD-10-CM

## 2021-09-04 DIAGNOSIS — Z0000 Encounter for general adult medical examination without abnormal findings: Secondary | ICD-10-CM

## 2021-09-04 DIAGNOSIS — Z6841 Body Mass Index (BMI) 40.0 and over, adult: Secondary | ICD-10-CM

## 2021-09-04 LAB — LIPID PANEL
Cholesterol: 141 mg/dL (ref 0–200)
HDL: 39 mg/dL — ABNORMAL LOW (ref >39.00)
LDL Cholesterol: 72 mg/dL (ref 0–99)
NonHDL: 101.74
Total CHOL/HDL Ratio: 4
Triglycerides: 149 mg/dL (ref 0.0–149.0)
VLDL: 29.8 mg/dL (ref 0.0–40.0)

## 2021-09-04 LAB — COMPREHENSIVE METABOLIC PANEL
ALT: 18 U/L (ref 0–53)
AST: 19 U/L (ref 0–37)
Albumin: 4.2 g/dL (ref 3.5–5.2)
Alkaline Phosphatase: 88 U/L (ref 39–117)
BUN: 10 mg/dL (ref 6–23)
CO2: 29 mEq/L (ref 19–32)
Calcium: 9.2 mg/dL (ref 8.4–10.5)
Chloride: 102 mEq/L (ref 96–112)
Creatinine, Ser: 1.22 mg/dL (ref 0.40–1.50)
GFR: 70.12 mL/min (ref >60.00)
Glucose, Bld: 95 mg/dL (ref 70–99)
Potassium: 4.3 mEq/L (ref 3.5–5.1)
Sodium: 138 mEq/L (ref 135–145)
Total Bilirubin: 0.4 mg/dL (ref 0.2–1.2)
Total Protein: 7.3 g/dL (ref 6.0–8.3)

## 2021-09-04 LAB — CBC WITH DIFFERENTIAL/PLATELET
Basophils Absolute: 0 10*3/uL (ref 0.0–0.1)
Basophils Relative: 0.5 % (ref 0.0–3.0)
Eosinophils Absolute: 0.2 10*3/uL (ref 0.0–0.7)
Eosinophils Relative: 4.3 % (ref 0.0–5.0)
HCT: 42.9 % (ref 39.0–52.0)
Hemoglobin: 14 g/dL (ref 13.0–17.0)
Lymphocytes Relative: 41.5 % (ref 12.0–46.0)
Lymphs Abs: 1.8 10*3/uL (ref 0.7–4.0)
MCHC: 32.6 g/dL (ref 30.0–36.0)
MCV: 79.5 fl (ref 78.0–100.0)
Monocytes Absolute: 0.5 10*3/uL (ref 0.1–1.0)
Monocytes Relative: 12.2 % — ABNORMAL HIGH (ref 3.0–12.0)
Neutro Abs: 1.8 10*3/uL (ref 1.4–7.7)
Neutrophils Relative %: 41.5 % — ABNORMAL LOW (ref 43.0–77.0)
Platelets: 241 10*3/uL (ref 150.0–400.0)
RBC: 5.4 Mil/uL (ref 4.22–5.81)
RDW: 14.5 % (ref 11.5–15.5)
WBC: 4.4 10*3/uL (ref 4.0–10.5)

## 2021-09-04 LAB — PSA: PSA: 0.44 ng/mL (ref 0.10–4.00)

## 2021-09-04 LAB — HEMOGLOBIN A1C: Hgb A1c MFr Bld: 6.1 % (ref 4.6–6.5)

## 2021-09-04 LAB — TSH: TSH: 6.52 u[IU]/mL — ABNORMAL HIGH (ref 0.35–5.50)

## 2021-09-04 MED ORDER — LOSARTAN POTASSIUM 100 MG PO TABS
100.0000 mg | ORAL_TABLET | Freq: Every day | ORAL | 3 refills | Status: DC
Start: 2021-09-04 — End: 2022-09-16

## 2021-09-04 MED ORDER — PHENTERMINE HCL 30 MG PO CAPS: 30.0000 mg | ORAL_CAPSULE | ORAL | 2 refills | Status: DC

## 2021-09-04 MED ORDER — LEVOTHYROXINE SODIUM 50 MCG PO TABS: 50.0000 ug | ORAL_TABLET | Freq: Every day | ORAL | 3 refills | Status: DC

## 2021-09-04 NOTE — Progress Notes (Signed)
Subjective:    Patient ID: Shawn Becker, male    DOB: [birth year], 51 y.o.   MRN: [id number]  HPI Patient presents for yearly preventative medicine examination. He is a pleasant 51 year old male who  has a past medical history of Allergy, Arthritis, Fatty liver, Hypertension, Hypothyroidism, Left ventricular hypertrophy, and Pre-diabetes.  Hypertension-currently maintained on losartan 100 mg daily and Toprol 25 mg.  He is followed by cardiology for LVH.  He denies chest pain, shortness of breath, dizziness, lightheadedness, or syncopal episodes. BP Readings from Last 3 Encounters:  09/04/21 138/86  10/30/20 138/90  09/20/20 128/88   Hashimoto's thyroiditis-controlled on Synthroid 50 mcg daily  DM Type 2 - diagnosed in February 2022 -he was originally started on Trulicity and metformin, but metformin was stopped due to joint pain.  He did tolerate Trulicity well and did not experience any GI effects but insurance would not cover it. He has not been on any diabetics mediation for roughly 6 months  Lab Results  Component Value Date   HGBA1C 5.8 (A) 10/30/2020   Hyper triglycerides - was on Lovaza 1g BID. He has not take this medication in some time due to being without insurance.  Lab Results  Component Value Date   CHOL 143 05/08/2020   HDL 29.90 (L) 05/08/2020   LDLCALC 82 01/06/2019   LDLDIRECT 72.0 05/08/2020   TRIG 367.0 (H) 05/08/2020   CHOLHDL 5 05/08/2020      All immunizations and health maintenance protocols were reviewed with the patient and needed orders were placed.  Appropriate screening laboratory values were ordered for the patient including screening of hyperlipidemia, renal function and hepatic function. If indicated by BPH, a PSA was ordered.  Medication reconciliation,  past medical history, social history, problem list and allergies were reviewed in detail with the patient  Goals were established with regard to weight loss, exercise, and  diet in  compliance with medications. He has been eating " better" but has not been exercising.   Wt Readings from Last 3 Encounters:  09/04/21 267 lb (121.1 kg)  10/30/20 279 lb (126.6 kg)  09/20/20 269 lb (122 kg)   He is up to date on routine colon cancer screening   Review of Systems  Constitutional: Negative.   HENT: Negative.    Eyes: Negative.   Respiratory: Negative.    Cardiovascular: Negative.   Gastrointestinal: Negative.   Endocrine: Negative.   Genitourinary: Negative.   Musculoskeletal: Negative.   Skin: Negative.   Allergic/Immunologic: Negative.   Neurological: Negative.   Hematological: Negative.   Psychiatric/Behavioral: Negative.    All other systems reviewed and are negative.  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       Social Determinants of Health   Financial Resource Strain: Not on file  Food Insecurity: Not on file  Transportation Needs: Not on file  Physical Activity: Not on file  Stress: Not on file  Social Connections: Not on file  Intimate Partner Violence: Not on file    History reviewed. No pertinent surgical history.  Family History  Problem Relation Age of  Onset   Diabetes Mother    Hypertension Mother    Mental illness Mother    Bipolar disorder Mother    Schizophrenia Mother    Obesity Mother    Diabetes Maternal Grandmother    Hypertension Maternal Grandmother    Cancer Maternal Grandfather        colon cancer   Colon cancer Maternal Grandfather    Colon polyps Maternal Grandfather    Hyperlipidemia Paternal Grandmother    Cancer Paternal Grandfather        prostate cancer   Alcoholism Father    Drug abuse Father    Colon polyps Maternal Uncle    Esophageal cancer Neg Hx    Rectal cancer Neg Hx    Stomach cancer Neg Hx     Allergies  Allergen Reactions   Metformin And Related     Joint Pain    Penicillins    Shellfish Allergy     Current Outpatient Medications on File Prior to Visit  Medication Sig Dispense Refill   Cholecalciferol (VITAMIN D-3) 125 MCG (5000 UT) TABS Take by mouth.     levothyroxine (SYNTHROID) 50 MCG tablet Take 1 tablet (50 mcg total) by mouth daily before breakfast. 30 tablet 0   losartan (COZAAR) 100 MG tablet Take 1 tablet (100 mg total) by mouth daily. 30 tablet 0   Multiple Vitamin (MULTIVITAMIN ADULT PO) Take by mouth. Enhanced energy formula from [facility] [address]     Turmeric (QC TUMERIC COMPLEX PO) Take by mouth.     omega-3 acid ethyl esters (LOVAZA) 1 g capsule Take by mouth 2 (two) times daily. (Patient not taking: Reported on 09/04/2021)     phentermine 30 MG capsule Take 1 capsule (30 mg total) by mouth every morning. (Patient not taking: Reported on 09/04/2021) 30 capsule 2   No current facility-administered medications on file prior to visit.    BP 138/86   Pulse 97   Temp 98.3 F (36.8 C) (Oral)   Ht 6' (1.829 m)   Wt 267 lb (121.1 kg)   SpO2 98%   BMI 36.21 kg/m       Objective:   Physical Exam Vitals and nursing note reviewed.  Constitutional:      General: He is not in acute distress.    Appearance: Normal appearance. He is well-developed and normal  weight.  HENT:     Head: Normocephalic and atraumatic.     Right Ear: Tympanic membrane, ear canal and external ear normal. There is no impacted cerumen.     Left Ear: Tympanic membrane, ear canal and external ear normal. There is no impacted cerumen.     Nose: Nose normal. No congestion or rhinorrhea.     Mouth/Throat:     Mouth: Mucous membranes are moist.     Pharynx: Oropharynx is clear.  No oropharyngeal exudate or posterior oropharyngeal erythema.  Eyes:     General:        Right eye: No discharge.        Left eye: No discharge.     Extraocular Movements: Extraocular movements intact.     Conjunctiva/sclera: Conjunctivae normal.     Pupils: Pupils are equal, round, and reactive to light.  Neck:     Vascular: No carotid bruit.     Trachea: No tracheal deviation.  Cardiovascular:     Rate and Rhythm: Normal rate and regular rhythm.     Pulses: Normal pulses.     Heart sounds: Normal heart sounds. No murmur heard.    No friction rub. No gallop.  Pulmonary:     Effort: Pulmonary effort is normal. No respiratory distress.     Breath sounds: Normal breath sounds. No stridor. No wheezing, rhonchi or rales.  Chest:     Chest wall: No tenderness.  Abdominal:     General: Bowel sounds are normal. There is no distension.     Palpations: Abdomen is soft. There is no mass.     Tenderness: There is no abdominal tenderness. There is no right CVA tenderness, left CVA tenderness, guarding or rebound.     Hernia: No hernia is present.  Musculoskeletal:        General: No swelling, tenderness, deformity or signs of injury. Normal range of motion.     Right lower leg: No edema.     Left lower leg: No edema.  Lymphadenopathy:     Cervical: No cervical adenopathy.  Skin:    General: Skin is warm and dry.     Capillary Refill: Capillary refill takes less than 2 seconds.     Coloration: Skin is not jaundiced or pale.     Findings: No bruising, erythema, lesion or rash.  Neurological:      General: No focal deficit present.     Mental Status: He is alert and oriented to person, place, and time.     Cranial Nerves: No cranial nerve deficit.     Sensory: No sensory deficit.     Motor: No weakness.     Coordination: Coordination normal.     Gait: Gait normal.     Deep Tendon Reflexes: Reflexes normal.  Psychiatric:        Mood and Affect: Mood normal.        Behavior: Behavior normal.        Thought Content: Thought content normal.        Judgment: Judgment normal.       Assessment & Plan:  1. Encounter for general adult medical examination with abnormal findings - Follow up in one year or sooner if needed - CBC with Differential/Platelet; Future - Comprehensive metabolic panel; Future - Hemoglobin A1c; Future - Lipid panel; Future - TSH; Future - TSH - Lipid panel - Hemoglobin A1c - Comprehensive metabolic panel - CBC with Differential/Platelet  2. Class 3 severe obesity due to excess calories with serious comorbidity and body mass index (BMI) of 40.0 to 44.9 in adult [facility]) - Encouraged exercise for weight loss. Continue diet  - CBC with Differential/Platelet; Future - Comprehensive metabolic panel; Future - Hemoglobin A1c; Future - Lipid panel; Future - TSH; Future - TSH - Lipid panel - Hemoglobin A1c - Comprehensive metabolic panel - CBC with Differential/Platelet  3. Controlled type 2 diabetes mellitus without complication, without long-term current use of insulin (HCC) - Consider adding Glipizide  -  CBC with Differential/Platelet; Future - Comprehensive metabolic panel; Future - Hemoglobin A1c; Future - Lipid panel; Future - TSH; Future - TSH - Lipid panel - Hemoglobin A1c - Comprehensive metabolic panel - CBC with Differential/Platelet  4. Essential hypertension - Controlled.  - No change in medications - CBC with Differential/Platelet; Future - Comprehensive metabolic panel; Future - Hemoglobin A1c; Future - Lipid panel; Future - TSH;  Future - TSH - Lipid panel - Hemoglobin A1c - Comprehensive metabolic panel - CBC with Differential/Platelet  5. Hashimoto's thyroiditis - Consider dose increase of synthroid  - CBC with Differential/Platelet; Future - Comprehensive metabolic panel; Future - Hemoglobin A1c; Future - Lipid panel; Future - TSH; Future - TSH - Lipid panel - Hemoglobin A1c - Comprehensive metabolic panel - CBC with Differential/Platelet  6. Prostate cancer screening  - PSA; Future - PSA  7. Need for hepatitis C screening test  - Hep C Antibody; Future - Hep C Antibody  8. Hypertriglyceridemia - Consider fenofibrate  - CBC with Differential/Platelet; Future - Comprehensive metabolic panel; Future - Hemoglobin A1c; Future - Lipid panel; Future - TSH; Future   Shirline Freesory Rhea Thrun, NP

## 2021-09-04 NOTE — Telephone Encounter (Signed)
Please advise 

## 2021-09-04 NOTE — Telephone Encounter (Signed)
Spoke to patient and informed him on his labs   TSH was elevated at 6+ - he reports missing some doses last week   A1c 6.1 - does not need medication at this time.   Obesity - he would like to go back on phentermine - will send in 30 days + 2 refills and have him follow up

## 2021-09-05 LAB — HEPATITIS C ANTIBODY
Hepatitis C Ab: NONREACTIVE
SIGNAL TO CUT-OFF: 0.13 (ref <1.00)

## 2021-10-07 ENCOUNTER — Encounter: Payer: Self-pay | Admitting: Adult Health

## 2021-10-30 ENCOUNTER — Encounter (INDEPENDENT_AMBULATORY_CARE_PROVIDER_SITE_OTHER): Payer: Self-pay

## 2021-12-05 IMAGING — CT CT ABD-PELV W/ CM
2 of 5 series · 15 of 46 positions shown, 17 images · IV contrast (iopamidol)
Comparison: None.

CLINICAL DATA: Right-sided scrotal pain for the past 1.5 months
with associated varicocele.

EXAM:
CT ABDOMEN AND PELVIS WITH CONTRAST
TECHNIQUE: Multidetector CT imaging of the abdomen and pelvis was performed
using the standard protocol following bolus administration of
intravenous contrast.
CONTRAST:  100mL H0YWSN-TCC IOPAMIDOL (H0YWSN-TCC) INJECTION 61%

[Series 2: abd pelvis 5.00 br40 s3 axial · axial · 0.90mm/px · z∈[+1091,+1551]mm · 12 of 106 slices shown, 14 images]
[im 7/106  soft-tissue]
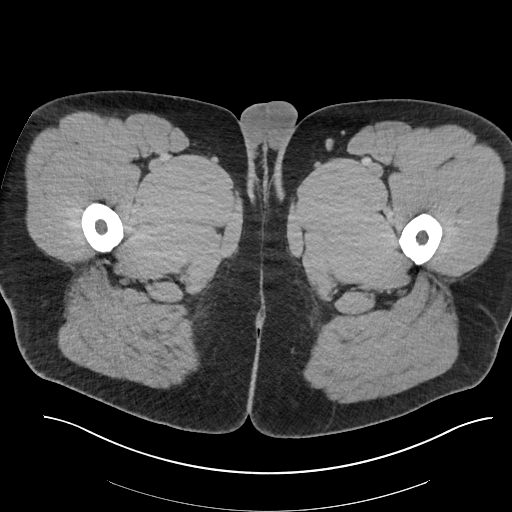
[im 7/106  bone]
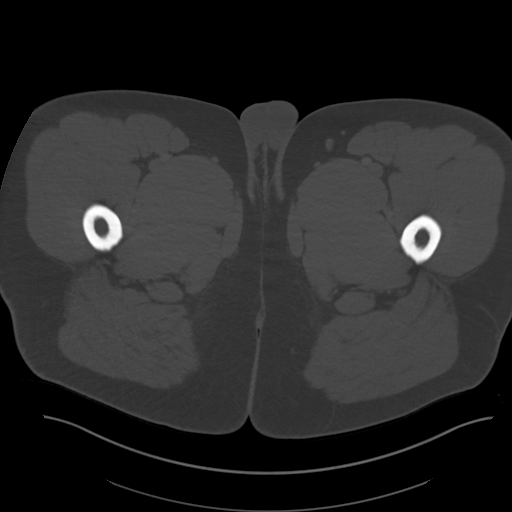
[im 14/106  soft-tissue]
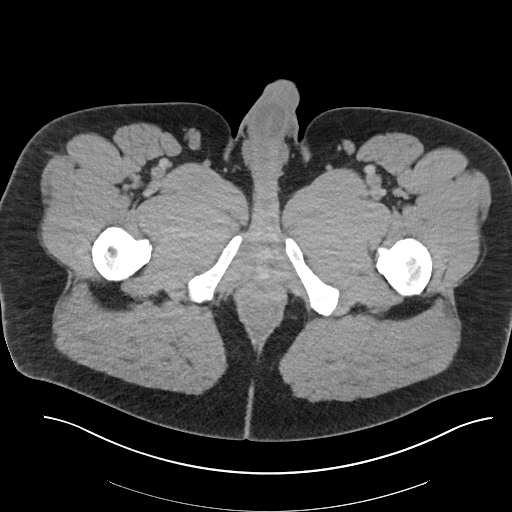
[im 27/106  soft-tissue]
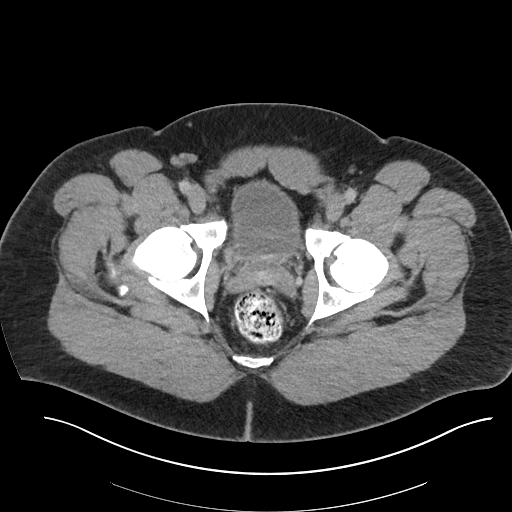
[im 33/106  soft-tissue]
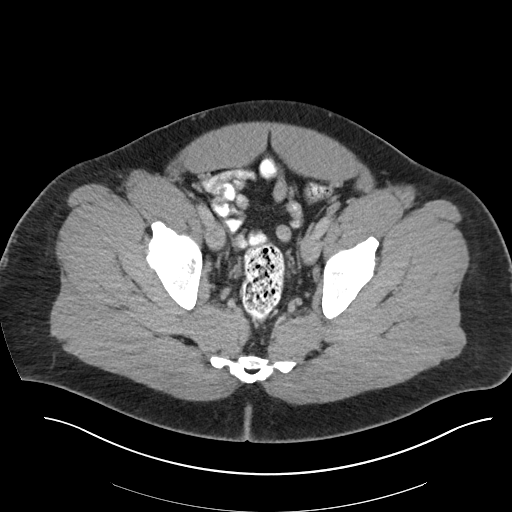
[im 40/106  soft-tissue]
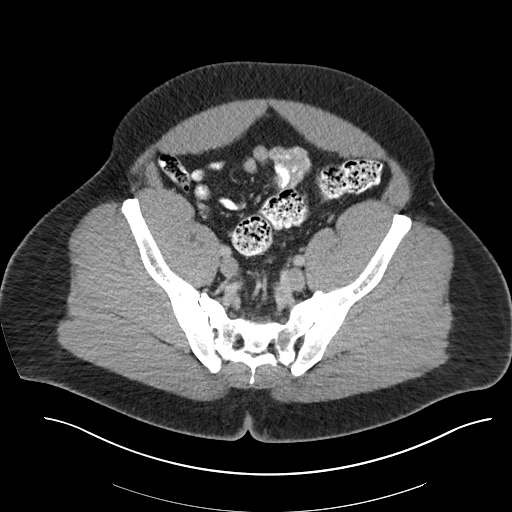
[im 46/106  soft-tissue]
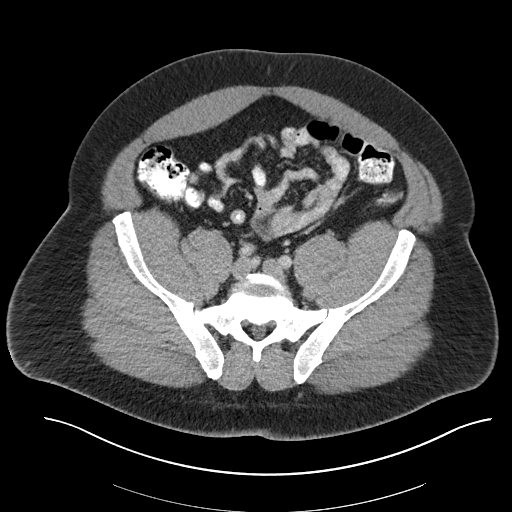
[im 60/106  soft-tissue]
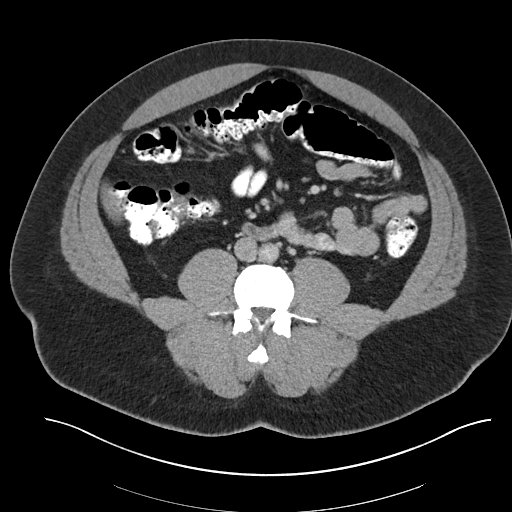
[im 66/106  soft-tissue]
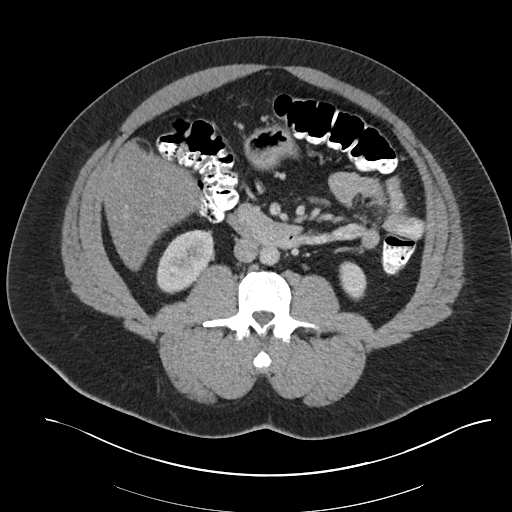
[im 73/106  soft-tissue]
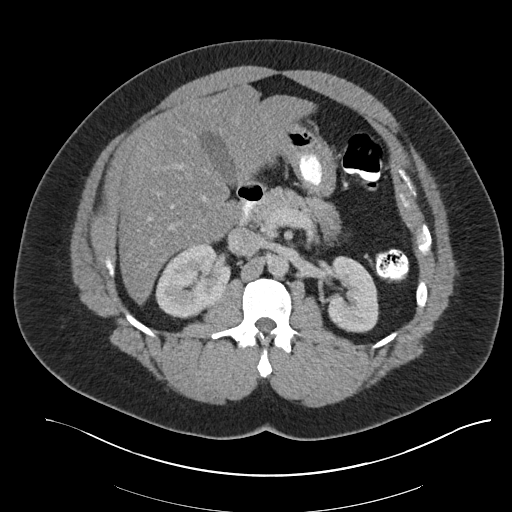
[im 73/106  bone]
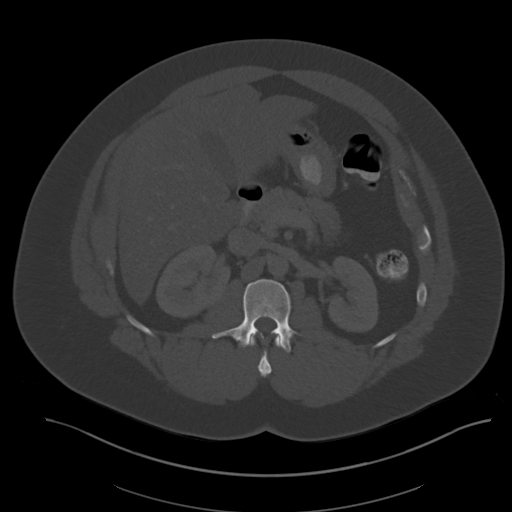
[im 79/106  soft-tissue]
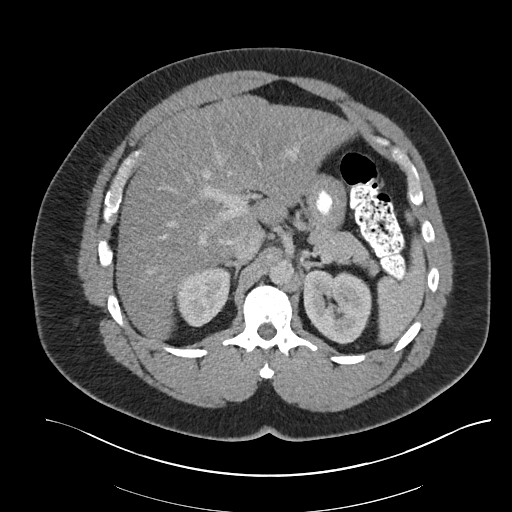
[im 92/106  soft-tissue]
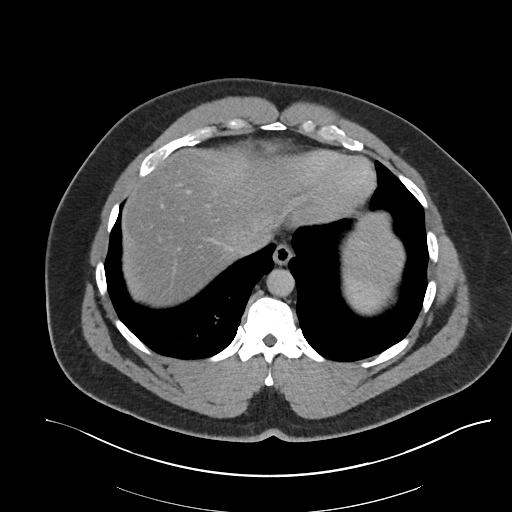
[im 99/106  soft-tissue]
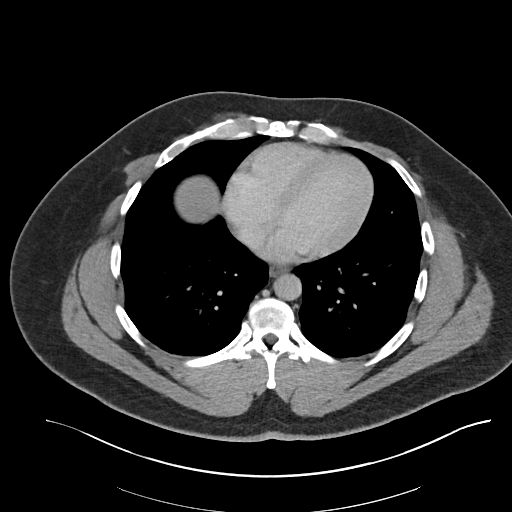

[Series 6: abd pelvis 2.00 br40 s3 cor · coronal · 0.90mm/px · 3 of 231 slices shown]
[im 77/231  soft-tissue]
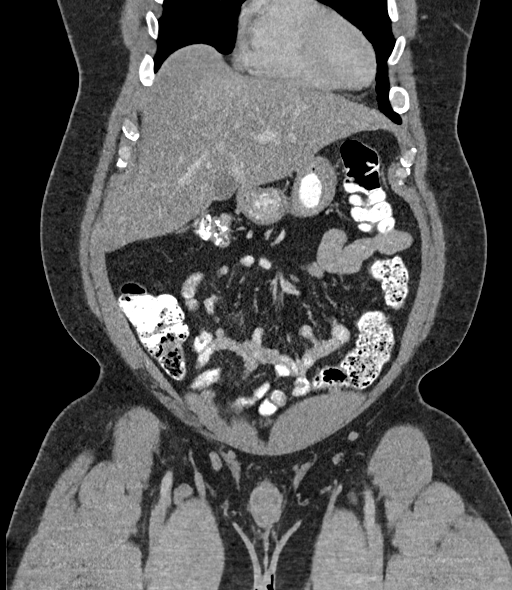
[im 103/231  soft-tissue]
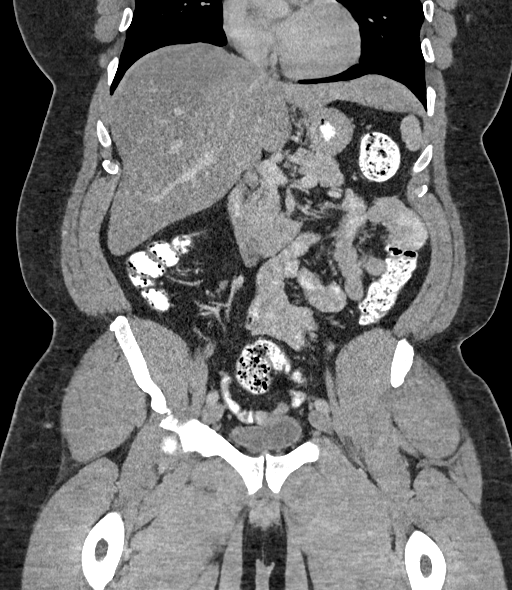
[im 128/231  soft-tissue]
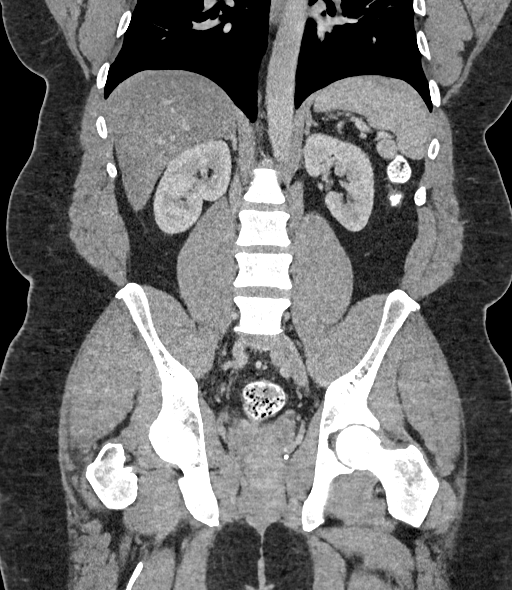

[15 of 46 positions shown; findings below may reference images not displayed]

FINDINGS: Lower chest: Limited visualization of the lower thorax demonstrates
minimal dependent subpleural ground-glass atelectasis. No discrete
focal airspace opacities.

Normal heart size.  No pericardial effusion.

Hepatobiliary: Normal hepatic contour. There is diffuse decreased
attenuation of the hepatic parenchyma compatible with hepatic
steatosis. No discrete hepatic lesions.

Normal noncontrast appearance of the gallbladder given degree of
distention. No radiopaque gallstones. No intra or extrahepatic
biliary ductal dilatation. No ascites.

Pancreas: Normal appearance of the pancreas.

Spleen: Normal appearance of the spleen.

Adrenals/Urinary Tract: There is symmetric enhancement and excretion
of the bilateral kidneys. No definite renal stones this postcontrast
examination. No discrete renal lesions. No urinary obstruction or
perinephric stranding.

Normal appearance the bilateral adrenal glands. Normal appearance of
the urinary bladder given degree of distention.

Stomach/Bowel: Ingested contrast extends to the level of the rectum.
No evidence of enteric obstruction. Normal appearance of the
terminal ileum and retrocecal appendix. There is mild apparent
circumferential wall thickening involving the gastric fundus and mid
body of the stomach (images 22 through 34, series 2), potentially
artifactual due to underdistention. Otherwise, there are no discrete
areas of bowel wall thickening. No pneumoperitoneum, pneumatosis or
portal venous gas.

Vascular/Lymphatic: Normal caliber of the abdominal aorta. The major
branch vessels of the abdominal aorta appear patent on this non CTA
examination.

The IVC and pelvic venous systems appear widely patent. No
definitive significantly hypertrophied gonadal veins are identified.
No significant varicosities are seen at the level of the imaged
proximal thighs or within the scrotum.

Reproductive: Normal appearance the prostate gland. Punctate
phleboliths are seen with the left hemipelvis. No free fluid the
pelvic cul-de-sac.

Other: There is a minimal amount of subcutaneous edema about the
midline of the low back.

Musculoskeletal: No acute or aggressive osseous abnormalities.
Congenital partial fusion of the T11-T12 vertebral bodies. Moderate
degenerative change of the bilateral hips with joint space loss,
subchondral sclerosis and osteophytosis.
IMPRESSION: 1. No explanation for patient's right-sided testicular/scrotal pain.
Specifically, no evidence of a hypertrophied gonadal vein or scrotal
varicocele. Further evaluation with testicular ultrasound could be
performed as indicated.
2. Advanced hepatic steatosis.  Correlation with LFTs is advised.
3. Apparent circumferential wall thickening involving the gastric
antrum and body of the stomach, potentially artifactual due to
underdistention. Clinical correlation is advised. Further evaluation
with endoscopy could be performed as indicated.

## 2022-01-24 ENCOUNTER — Other Ambulatory Visit (HOSPITAL_COMMUNITY): Payer: Self-pay | Admitting: General Surgery

## 2022-01-24 DIAGNOSIS — Z6835 Body mass index (BMI) 35.0-35.9, adult: Secondary | ICD-10-CM

## 2022-01-24 DIAGNOSIS — E66812 Obesity, class 2: Secondary | ICD-10-CM

## 2022-02-10 ENCOUNTER — Encounter: Payer: 59 | Attending: General Surgery | Admitting: Dietician

## 2022-02-10 ENCOUNTER — Encounter: Payer: Self-pay | Admitting: Dietician

## 2022-02-10 VITALS — Ht 72.0 in | Wt 293.4 lb

## 2022-02-10 DIAGNOSIS — E669 Obesity, unspecified: Secondary | ICD-10-CM | POA: Insufficient documentation

## 2022-02-10 DIAGNOSIS — E119 Type 2 diabetes mellitus without complications: Secondary | ICD-10-CM | POA: Insufficient documentation

## 2022-02-10 NOTE — Progress Notes (Signed)
Nutrition Assessment for Bariatric Surgery Medical Nutrition Therapy Appt Start Time: 2:45    End Time: 3:58  Patient was seen on 02/10/2022 for Pre-Operative Nutrition Assessment. Letter of approval faxed to [facility] bariatric surgery program coordinator on 02/10/2022.   Referral stated Supervised Weight Loss (SWL) visits needed: 3 Months  Planned surgery: RYGB   NUTRITION ASSESSMENT   Anthropometrics  Start weight at [facility]: 293.4 lbs (date: 02/10/2022)  Height: 72 in BMI: 39.79 kg/m2     Clinical  Medical hx: hypertension, diabetes mellitus type 2, osteoarthritis of multiple joints, Hashimoto's thyroiditis  Medications: Losartan HCT, levothyroxine, vit D  Labs: A1C 6.3; TSH 5.180 Notable signs/symptoms: none noted Any previous deficiencies? No  Micronutrient Nutrition Focused Physical Exam: Hair: No issues observed Eyes: No issues observed Mouth: No issues observed Neck: No issues observed Nails: No issues observed Skin: No issues observed  Lifestyle & Dietary Hx  Patient lives with his wife. Mr. Patient performs the food shopping and pt's wife prepares the meals. He reports that he typically skips or misses 2 out of 21 possible meals per week. He may have 4-6 meals per week that are take-out or at a restaurant.  Patient works as a Lawyer and a Regulatory affairs officer. He denies binge eating and denies feeling shame and/or guilt after eating too much food.  He denies having used laxatives or vomiting to facilitate weight loss. He denies emotional eating during times of stress. He states that he knows the difference between hunger and thirst and can tell when he is full. Pt states he is a sweets person. Pt states he does not want to give up sweet tea yet, stating he will give up soda first. Pt states he wants to workout with weights after surgery.  Physical Activity: ADLs  Sleep Hygiene: duration and quality: good, 6-7  Current Patient Perceived  Stress Level as stated by pt on a scale of 1-10: 2-3      Stress Management Techniques: pause/pray, reach out to mentors  Fruit servings per week: 2 Non starchy vegetable servings per week: 4-5 Whole Grains per week: 0-1   24-Hr Dietary Recall First Meal: pancake on stick with sausage Snack: chips Second Meal: chili beans or chicken and asparagus Snack: cookie or chips or sandwich Third Meal: chicken or pork chop, asparagus or broccoli Snack: cookie Beverages: diet Dr Reino Kent and regular pepsi (half and half), sweet tea, lemonade  Alcoholic beverages per week: 0   Estimated Energy Needs Calories: 2000   NUTRITION DIAGNOSIS  Overweight/obesity ([place]-3.3) related to past poor dietary habits and physical inactivity as evidenced by patient w/ planned RYGB surgery following dietary guidelines for continued weight loss.  NUTRITION INTERVENTION  Nutrition counseling (C-1) and education (E-2) to facilitate bariatric surgery goals.  Educated pt on micronutrient deficiencies post surgery and strategies to mitigate that risk   Pre-Op Goals Reviewed with the Patient Track food and beverage intake (pen and paper, MyFitness Pal, Baritastic app, etc.) Make healthy food choices while monitoring portion sizes Consume 3 meals per day or try to eat every 3-5 hours Avoid concentrated sugars and fried foods Keep sugar & fat in the single digits per serving on food labels Practice CHEWING your food (aim for applesauce consistency) Practice not drinking 15 minutes before, during, and 30 minutes after each meal and snack Avoid all carbonated beverages (ex: soda, sparkling beverages)  Limit caffeinated beverages (ex: coffee, tea, energy drinks) Avoid all sugar-sweetened beverages (ex: regular soda, sports drinks)  Avoid alcohol  Aim for 64-100 ounces of FLUID daily (with at least half of fluid intake being plain water)  Aim for at least 60-80 grams of PROTEIN daily Look for a liquid protein source  that contains ?15 g protein and ?5 g carbohydrate (ex: shakes, drinks, shots) Make a list of non-food related activities Physical activity is an important part of a healthy lifestyle so keep it moving! The goal is to reach 150 minutes of exercise per week, including cardiovascular and weight baring activity.  *Goals that are bolded indicate the pt would like to start working towards these  Handouts Provided Include  Bariatric Surgery handouts (Nutrition Visits, Pre-Op Goals, Protein Shakes, Vitamins & Minerals)  Learning Style & Readiness for Change Teaching method utilized: Visual & Auditory  Demonstrated degree of understanding via: Teach Back  Readiness Level: Contemplative Barriers to learning/adherence to lifestyle change: nothing identified  RD's Notes for Next Visit Patient progress toward chosen goals.    MONITORING & EVALUATION Dietary intake, weekly physical activity, body weight, and pre-op goals reached at next nutrition visit.    Next Steps  Patient is to follow up at [facility] in two weeks for first SWL visit.

## 2022-02-12 ENCOUNTER — Ambulatory Visit (HOSPITAL_COMMUNITY)
Admission: RE | Admit: 2022-02-12 | Discharge: 2022-02-12 | Disposition: A | Discharge status: Home / Self Care | Payer: 59 | Source: Ambulatory Visit | Attending: General Surgery | Admitting: General Surgery

## 2022-02-12 ENCOUNTER — Other Ambulatory Visit: Payer: Self-pay

## 2022-02-12 DIAGNOSIS — Z6835 Body mass index (BMI) 35.0-35.9, adult: Secondary | ICD-10-CM | POA: Insufficient documentation

## 2022-02-12 DIAGNOSIS — Z01818 Encounter for other preprocedural examination: Secondary | ICD-10-CM | POA: Insufficient documentation

## 2022-02-24 ENCOUNTER — Encounter: Payer: 59 | Attending: General Surgery | Admitting: Dietician

## 2022-02-24 ENCOUNTER — Encounter: Payer: Self-pay | Admitting: Dietician

## 2022-02-24 VITALS — Ht 72.0 in | Wt 291.4 lb

## 2022-02-24 DIAGNOSIS — E119 Type 2 diabetes mellitus without complications: Secondary | ICD-10-CM | POA: Insufficient documentation

## 2022-02-24 DIAGNOSIS — E669 Obesity, unspecified: Secondary | ICD-10-CM | POA: Diagnosis present

## 2022-02-24 NOTE — Progress Notes (Signed)
Supervised Weight Loss Visit Bariatric Nutrition Education Appt Start Time: 4:45    End Time: 5:10  Planned surgery: RYGB   NUTRITION ASSESSMENT   Anthropometrics  Start weight at [facility]: 293.4 lbs (date: 02/10/2022)  Height: 72 in Weight today: 291.4 lbs BMI: 39.52 kg/m2     Clinical  Medical hx: hypertension, diabetes mellitus type 2, osteoarthritis of multiple joints, Hashimoto's thyroiditis  Medications: Losartan HCT, levothyroxine, vit D  Labs: A1C 6.3; TSH 5.180 Notable signs/symptoms: none noted Any previous deficiencies? No  Lifestyle & Dietary Hx  Pt states he   Estimated daily fluid intake: 33 oz Supplements:  Current average weekly physical activity: not a lot right now, stating he has arthritis   24-Hr Dietary Recall First Meal: pancake on stick with sausage Snack: chips Second Meal: chili beans or chicken and asparagus Snack: cookie or chips or sandwich Third Meal: chicken or pork chop, asparagus or broccoli Snack: cookie Beverages: sweet tea, lemonade, water  Estimated Energy Needs Calories: 2000   NUTRITION DIAGNOSIS  Overweight/obesity ([place]-3.3) related to past poor dietary habits and physical inactivity as evidenced by patient w/ planned RYGB surgery following dietary guidelines for continued weight loss.   NUTRITION INTERVENTION  Nutrition counseling (C-1) and education (E-2) to facilitate bariatric surgery goals.  Pre-Op Goals Reviewed with the Patient Track food and beverage intake (pen and paper, MyFitness Pal, Baritastic app, etc.) Make healthy food choices while monitoring portion sizes Consume 3 meals per day or try to eat every 3-5 hours Avoid concentrated sugars and fried foods Keep sugar & fat in the single digits per serving on food labels Practice CHEWING your food (aim for applesauce consistency) Practice not drinking 15 minutes before, during, and 30 minutes after each meal and snack Avoid all carbonated beverages (ex: soda,  sparkling beverages)  Limit caffeinated beverages (ex: coffee, tea, energy drinks) Avoid all sugar-sweetened beverages (ex: regular soda, sports drinks)  Avoid alcohol  Aim for 64-100 ounces of FLUID daily (with at least half of fluid intake being plain water)  Aim for at least 60-80 grams of PROTEIN daily Look for a liquid protein source that contains ?15 g protein and ?5 g carbohydrate (ex: shakes, drinks, shots) Make a list of non-food related activities Physical activity is an important part of a healthy lifestyle so keep it moving! The goal is to reach 150 minutes of exercise per week, including cardiovascular and weight baring activity.  *Goals that are bolded indicate the pt would like to start working towards these  Pre-Op Goals Progress & New Goals Physical activity is an important part of a healthy lifestyle so keep it moving! The goal is to reach 150 minutes of exercise per week, including cardiovascular and weight baring activity. Avoid all carbonated beverages (ex: soda, sparkling beverages) Avoid all sugar-sweetened beverages (ex: regular soda, sports drinks)   Handouts Provided Include    Learning Style & Readiness for Change Teaching method utilized: Visual & Auditory  Demonstrated degree of understanding via: Teach Back  Readiness Level: contemplative Barriers to learning/adherence to lifestyle change: habit of sugar sweetened beverages  RD's Notes for next Visit  Patient progress toward chosen goals.   MONITORING & EVALUATION Dietary intake, weekly physical activity, body weight, and pre-op goals in 1 month.   Next Steps  Patient is to return to [facility] in one month for next SWL visit.

## 2022-03-31 ENCOUNTER — Encounter: Payer: Self-pay | Admitting: Dietician

## 2022-03-31 ENCOUNTER — Encounter: Payer: 59 | Attending: General Surgery | Admitting: Dietician

## 2022-03-31 VITALS — Ht 72.0 in | Wt 298.1 lb

## 2022-03-31 DIAGNOSIS — E669 Obesity, unspecified: Secondary | ICD-10-CM | POA: Diagnosis not present

## 2022-03-31 DIAGNOSIS — E119 Type 2 diabetes mellitus without complications: Secondary | ICD-10-CM | POA: Insufficient documentation

## 2022-03-31 NOTE — Progress Notes (Signed)
Supervised Weight Loss Visit Bariatric Nutrition Education Appt Start Time: 4:48    End Time: 5:19  2 out of 3 SWL Appointments   Planned surgery: RYGB   NUTRITION ASSESSMENT   Anthropometrics  Start weight at [facility]: 293.4 lbs (date: 02/10/2022)  Height: 72 in Weight today: 298.1 lbs BMI: 40.43 kg/m2     Clinical  Medical hx: hypertension, diabetes mellitus type 2, osteoarthritis of multiple joints, Hashimoto's thyroiditis  Medications: Losartan HCT, levothyroxine, vit D  Labs: A1C 6.3; TSH 5.180 Notable signs/symptoms: none noted Any previous deficiencies? No  Lifestyle & Dietary Hx  Pt states he went to urgent care, stating he was dizzy and stayed in bed all day until his wife told him that he needed to go to urgent care. Pt states by the next day he was fine. Pt states he is starting school next week, stating he will be able to drink more fluids.  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 Pt his arthritis is bothering him in his hip. Pt states his physical activity is not a lot right now. Pt states he goes to the church to walk, stating he walks the sactuary, and states he is getting on a routine to be able to go to the gym with light weights. Pt states he struggled avoiding sugar sweetened beverages. Pt agreeable to re-engage with goal of avoiding sugar sweetened beverages. Pt states he uses "Stur" brand water flavorings with zero sugar.  Estimated daily fluid intake: 33 oz Supplements:  Current average weekly physical activity: not a lot right now, stating he has arthritis. Pt states he goes to the church to walk, stating he walks the sanctuary, and states he is getting on a routine to be able to go to the gym with light weights.  24-Hr Dietary Recall First Meal: pancake on stick with sausage Snack: chips Second Meal: chili beans or chicken and asparagus Snack: cookie or chips or sandwich Third Meal: chicken or  pork chop, asparagus or broccoli Snack: cookie Beverages: sweet tea, lemonade, water  Estimated Energy Needs Calories: 2000   NUTRITION DIAGNOSIS  Overweight/obesity ([place]-3.3) related to past poor dietary habits and physical inactivity as evidenced by patient w/ planned RYGB surgery following dietary guidelines for continued weight loss.   NUTRITION INTERVENTION  Nutrition counseling (C-1) and education (E-2) to facilitate bariatric surgery goals.  Encouraged pt to engage in regular physical activity. One of the most important things you can do for your health. Being physically active can improve your brain health, help manage weight, reduce the risk of disease, strengthen bones and muscles, and improve your ability to do everyday activities. Encouraged patient to avoid sugar sweetened beverages, and avoid drinking with meals and snacks.  Pre-Op Goals Progress & New Goals Re-engage: Increase Physical Activity; an important part of a healthy lifestyle so keep it moving! The goal is to reach 150 minutes of exercise per week, including cardiovascular and weight baring activity. Re-engage: Avoid all carbonated beverages (ex: soda, sparkling beverages) Re-engage: Avoid all sugar-sweetened beverages (ex: regular soda, sports drinks); try alkaline water. New: Practice not drinking with meal and snacks  Handouts Provided Include    Learning Style & Readiness for Change Teaching method utilized: Visual & Auditory  Demonstrated degree of understanding via: Teach Back  Readiness Level: contemplative Barriers to learning/adherence to lifestyle change: habit of sugar sweetened beverages  RD's Notes for next Visit  Patient progress toward chosen  goals.  MONITORING & EVALUATION Dietary intake, weekly physical activity, body weight, and pre-op goals in 1 month.   Next Steps  Patient is to return to [facility] in one month for next SWL visit.

## 2022-04-01 ENCOUNTER — Ambulatory Visit (INDEPENDENT_AMBULATORY_CARE_PROVIDER_SITE_OTHER): Payer: 59 | Admitting: Licensed Clinical Social Worker

## 2022-04-01 DIAGNOSIS — F4320 Adjustment disorder, unspecified: Secondary | ICD-10-CM

## 2022-04-02 NOTE — Progress Notes (Signed)
Comprehensive Clinical Assessment (CCA) Note  04/02/2022 Shawn Becker [id number]  Chief Complaint:  Chief Complaint  Patient presents with   Obesity   Visit Diagnosis: Adjustment disorder, unspecified type     CCA Biopsychosocial Intake/Chief Complaint:  Obesity,bariatic assessment  Current Symptoms/Problems: no history of anxiety or depression, sleep is regulated, moderate energy for the most part but energy can drop at times,   Patient Reported Schizophrenia/Schizoaffective Diagnosis in Past: No   Strengths: musician, disciplined, structured, empathetic, leadership  Preferences: prefers being with family, prefers to learn,  Abilities: musician, disciplined, structured, empathetic, leadership, sing, music production, Clinical research associate   Type of Services Patient Feels are Needed: Bariatric   Initial Clinical Notes/Concerns: History of obesity: Has always been bigger but surgery is more for arthritis, etc.  Family history of obesity: Mother, Grandmother  Weight loss attempts: exercise, high protein, low carb, phetermine,  Current Diet: increasing protein, stopped soda,  Co-morbid: high blood pressure, pre-diabetic,   Previous procedures: none   Mental Health Symptoms Depression:  None   Duration of Depressive symptoms: No data recorded  Mania:  None   Anxiety:   None   Psychosis:  None   Duration of Psychotic symptoms: No data recorded  Trauma:  None   Obsessions:  None   Compulsions:  None   Inattention:  None   Hyperactivity/Impulsivity:  None   Oppositional/Defiant Behaviors:  None   Emotional Irregularity:  None   Other Mood/Personality Symptoms:   None    Mental Status Exam Appearance and self-care  Stature:  Average   Weight:  Obese   Clothing:  Casual   Grooming:  Normal   Cosmetic use:  None   Posture/gait:  Normal   Motor activity:  Not Remarkable   Sensorium  Attention:  Normal   Concentration:  Normal   Orientation:  X5    Recall/memory:  Normal   Affect and Mood  Affect:  Appropriate   Mood:  Euthymic   Relating  Eye contact:  Normal   Facial expression:  Responsive   Attitude toward examiner:  Cooperative   Thought and Language  Speech flow: Normal   Thought content:  Appropriate to Mood and Circumstances   Preoccupation:  None   Hallucinations:  None   Organization:  No data recorded  [employer] of Knowledge:  Good   Intelligence:  Average   Abstraction:  Normal   Judgement:  Good   Reality Testing:  Realistic   Insight:  Good   Decision Making:  Normal   Social Functioning  Social Maturity:  Responsible   Social Judgement:  Normal   Stress  Stressors:  No data recorded  Coping Ability:  Normal   Skill Deficits:  None   Supports:  Church; Family     Religion: Religion/Spirituality Are You A Religious Person?: Yes What is Your Religious Affiliation?: Non-Denominational How Might This Affect Treatment?: Support in treatment  Leisure/Recreation: Leisure / Recreation Do You Have Hobbies?: Yes Leisure and Hobbies: ride motorcycle  Exercise/Diet: Exercise/Diet Do You Exercise?: No (doesn't workout consistently but has started back) Have You Gained or Lost A Significant Amount of Weight in the Past Six Months?: No Do You Follow a Special Diet?: Yes Type of Diet: see above Do You Have Any Trouble Sleeping?: No   CCA Employment/Education Employment/Work Situation: Employment / Work Situation Employment Situation: Employed 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 How Long has Patient Been Employed?: 20+ years Are You Satisfied With  Your Job?: Yes 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, Theme park manager) Work Stressors: None Patient's Job has Been Impacted by Current Illness: No What is the Longest Time Patient has Held a Job?: 20+ years Where was the Patient Employed at that Time?: Self employed Has Patient ever  Been in the [employer]?: No  Education: Education Is Patient Currently Attending School?: Yes School Currently Attending: [employer] Last Grade Completed: 12 Name of High School: [employer] Did You Graduate From [employer]?: Yes Did Physicist, medical?: Yes What Type of College Degree Do you Have?: Buyer, retail of Science Did [employer] Attend Graduate School?: Yes What is Your Post Graduate Degree?: [employer] What Was Your Major?: Religion Did You Have Any Special Interests In School?: Psychology, Critical Thinking Did You Have An Individualized Education Program (IIEP): No Did You Have Any Difficulty At School?: No Patient's Education Has Been Impacted by Current Illness: No   CCA Family/Childhood History Family and Relationship History: Family history Marital status: Married Number of Years Married: 6 What types of issues is patient dealing with in the relationship?: None Additional relationship information: Previously married Are you sexually active?: Yes What is your sexual orientation?: Heterosexual Has your sexual activity been affected by drugs, alcohol, medication, or emotional stress?: None Does patient have children?: Yes How many children?: 5 How is patient's relationship with their children?: 2 sons, 3 daughters: pretty good  Childhood History:  Childhood History By whom was/is the patient raised?: Grandparents Additional childhood history information: Grandparents mainly raised him. Mother struggled with mental health. Father was around but not a major part of life. Patient describes childhood as "great" Description of patient's relationship with caregiver when they were a child: Grandparents: awesome, Mother: good Patient's description of current relationship with people who raised him/her: Grandparents: deceased, Mother: good How were you disciplined when you got in trouble as a child/adolescent?: spanked, talked to, grounded, things  taken away Does patient have siblings?: Yes Number of Siblings: 2 Description of patient's current relationship with siblings: Sisters: Oldest sister: very close, sister: good relationship Did patient suffer any verbal/emotional/physical/sexual abuse as a child?: No Did patient suffer from severe childhood neglect?: No Has patient ever been sexually abused/assaulted/raped as an adolescent or adult?: No Was the patient ever a victim of a crime or a disaster?: No Witnessed domestic violence?: No Has patient been affected by domestic violence as an adult?: No  Child/Adolescent Assessment:     CCA Substance Use Alcohol/Drug Use: Alcohol / Drug Use Pain Medications: See patient MAR Prescriptions: See patient MAR Over the Counter: See patient MAR History of alcohol / drug use?: No history of alcohol / drug abuse                         ASAM's:  Six Dimensions of Multidimensional Assessment  Dimension 1:  Acute Intoxication and/or Withdrawal Potential:   Dimension 1:  Description of individual's past and current experiences of substance use and withdrawal: None  Dimension 2:  Biomedical Conditions and Complications:   Dimension 2:  Description of patient's biomedical conditions and  complications: None  Dimension 3:  Emotional, Behavioral, or Cognitive Conditions and Complications:  Dimension 3:  Description of emotional, behavioral, or cognitive conditions and complications: None  Dimension 4:  Readiness to Change:  Dimension 4:  Description of Readiness to Change criteria: NOne  Dimension 5:  Relapse, Continued use, or Continued Problem Potential:  Dimension 5:  Relapse, continued  use, or continued problem potential critiera description: None  Dimension 6:  Recovery/Living Environment:  Dimension 6:  Recovery/Iiving environment criteria description: None  ASAM Severity Score: ASAM's Severity Rating Score: 0  ASAM Recommended Level of Treatment:     Substance use Disorder  (SUD)    Recommendations for Services/Supports/Treatments: Recommendations for Services/Supports/Treatments Recommendations For Services/Supports/Treatments: Other (Comment) (Bariatric)  DSM5 Diagnoses: Patient Active Problem List   Diagnosis Date Noted   Controlled type 2 diabetes mellitus without complication, without long-term current use of insulin (HCC) 05/03/2019   Class 3 severe obesity due to excess calories with serious comorbidity and body mass index (BMI) of 40.0 to 44.9 in adult [facility]) 05/02/2019   Left ventricular hypertrophy 04/14/2017   Hashimoto's thyroiditis 04/15/2016   Essential hypertension 04/15/2016    Patient Centered Plan: Patient is on the following Treatment Plan(s):  No treatment plan needed  Behavioral Health Assessment:  Behavioral Health Assessment Patient Name Shawn Becker Date of Birth Aug 16, 1970  Age 19 Date of Interview 01.09.2024  Gender Male Date of Report 01.10.2024  Purpose Bariatric/Weight-loss Surgery (pre-operative evaluation)     Assessment Instruments:  DSM-5-TR Self-Rated Level 1 Cross-Cutting Symptom Measure--Adult Severity Measure for Generalized Anxiety Disorder--Adult EAT-26  Chief Complain: Obesity  Client Background: Patient is a 52 year old African American male seeking weight loss surgery. Patient has a bachelors degree. 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  Patient has been married for 6 years and has 5 children . The patient is 6 feet 0 inches tall and 325 lbs., placing her at a BMI of 44.1 classifying him in the obese range and at further risk of co-morbid diseases.  Weight History: Patient has always been heavy but it has not impacted his life negatively until recently when arthritis started impacting his knees, etc.    Eating Patterns:  Patient is focusing on improving his eating by increasing his protein, and has stopped soda.   Related Medical Issues and past surgery:    Patient has been diagnosed with high  blood pressure and pre-diabetic.   Family History of Obesity: Patient's mother and grandmother were obese.   Tobacco Use: Patient denies tobacco use.   PATIENT BEHAVIORAL ASSESSMENT SCORES  Personal History of Mental Illness:  Patient denies anxiety. Patient denies depression. Denies SI, Denies Psychosis, Denies self injurious behavior. Weight gain has been a gradual concern which grief and past jobs complicated.   Mental Status Examination: Patient was oriented x5 (person, place, situation, time, and object). She was appropriately groomed, and neatly dressed. Patient was alert, engaged, pleasant, and cooperative. Patient denies suicidal and homicidal ideations. Patient denies self-injury. Patient denies psychosis including auditory and visual hallucinations  DSM-5-TR Self-Rated Level 1 Cross-Cutting Symptom Measure--Adult: Patient rated herself 0 on the Depression domain and Anxiety Domain as well as other domains. Patient denies the presence of mental health symptoms.   Severity Measure for Generalized Anxiety Disorder--Adult: Patient completed a 10-question scale. Total scores can range from 0 to 40. A raw score is calculated by summing the answer to each question, and an average total score is achieved by dividing the raw score by the number of items (e.g., 10). Patient had a total raw score of 0 out of 40 which was divided by the total number of questions answered (10) to get an average score of 0 which indicates no anxiety.   EAT-26: The EAT-26 is a twenty-six-question screening tool to identify symptoms of eating disorders and disordered eating. The patient scored 3 out of 26.  Scores below a 20 are considered not meeting criteria for disordered eating. Patient denies inducing vomiting, or intentional meal skipping. Patient denies binge eating behaviors. Patient denies laxative abuse. Patient does not meet criteria for a DSM-V eating disorder.  Conclusion & Recommendations:   Shawn Saputo.  Becker has no mental health symptoms present or history of mental health. Patient understands the procedure, the risks associated with it, and the importance of post-operative holistic care (Physical, Spiritual/Values, Relationships, and Mental/Emotional health) with access to resources for support as needed. The patient has made an informed decision to proceed with the procedure. The patient is motivated and expressed understanding of the post-surgical requirements. Patient's psychological assessment will be valid from today's date for 6 months (07.09.2024). Then, a follow-up appointment will be needed to re-evaluate the patient's psychological status.   I see no significant psychological factors that would hinder the success of bariatric surgery. I support Shawn Becker's desire for Bariatric Surgery.   Shawn Bickers, LCSW    Referrals to Alternative Service(s): Referred to Alternative Service(s):   Place:   Date:   Time:    Referred to Alternative Service(s):   Place:   Date:   Time:    Referred to Alternative Service(s):   Place:   Date:   Time:    Referred to Alternative Service(s):   Place:   Date:   Time:      Collaboration of Care: Other provider involved in patient's care [facility]  Patient/Guardian was advised Release of Information must be obtained prior to any record release in order to collaborate their care with an outside provider. Patient/Guardian was advised if they have not already done so to contact the registration department to sign all necessary forms in order for [place] to release information regarding their care.   Consent: Patient/Guardian gives verbal consent for treatment and assignment of benefits for services provided during this visit. Patient/Guardian expressed understanding and agreed to proceed.   Shawn Bickers, LCSW

## 2022-04-28 ENCOUNTER — Encounter: Payer: Self-pay | Admitting: Dietician

## 2022-04-28 ENCOUNTER — Encounter: Payer: 59 | Attending: General Surgery | Admitting: Dietician

## 2022-04-28 VITALS — Ht 72.0 in | Wt 296.5 lb

## 2022-04-28 DIAGNOSIS — E669 Obesity, unspecified: Secondary | ICD-10-CM | POA: Diagnosis present

## 2022-04-28 DIAGNOSIS — E119 Type 2 diabetes mellitus without complications: Secondary | ICD-10-CM | POA: Diagnosis present

## 2022-04-28 NOTE — Progress Notes (Signed)
Supervised Weight Loss Visit Bariatric Nutrition Education Appt Start Time: 4:50    End Time: 5:33  3 out of 3 SWL Appointments   Pt completed visits.   Pt has cleared nutrition requirements.   Planned surgery: RYGB   NUTRITION ASSESSMENT   Anthropometrics  Start weight at [facility]: 293.4 lbs (date: 02/10/2022)  Height: 72 in Weight today: 296.5 lbs BMI: 40.21 kg/m2     Clinical  Medical hx: hypertension, diabetes mellitus type 2, osteoarthritis of multiple joints, Hashimoto's thyroiditis  Medications: Losartan HCT, levothyroxine, vit D  Labs: A1C 6.3; TSH 5.180 Notable signs/symptoms: none noted Any previous deficiencies? No  Lifestyle & Dietary Hx  Pt states he started school last month, and joined a group that works out Tuesdays and Thursdays, and his wife is ready to start working out on other days at [employer] [place]. Pt states he wants to check out water activities for his arthritis. Pt states he has avoided sugar sweetened, stating he is drinking more water and tried vitamin water zero. Pt states school routine has help him to sip water during class. Pt states he needs to work on avoiding sweets at night.  Pt states it is not every night. Pt states he will substitute other sweets with fruit. Pt states he still has "Stur" water flavorings, but has mostly been drinking water.  Estimated daily fluid intake: 48 oz Supplements:  Current average weekly physical activity: Tuesday and Thursday gym; other days with wife at [facility] downtown.  24-Hr Dietary Recall First Meal: pancake on stick with sausage Snack: chips Second Meal: chili beans or chicken and asparagus Snack: cookie or chips or sandwich Third Meal: chicken or pork chop, asparagus or broccoli Snack: fruit Beverages: water, vitamin water zero  Estimated Energy Needs Calories: 2000   NUTRITION DIAGNOSIS  Overweight/obesity ([place]-3.3) related to past poor dietary habits and physical inactivity as evidenced by patient  w/ planned RYGB surgery following dietary guidelines for continued weight loss.   NUTRITION INTERVENTION  Nutrition counseling (C-1) and education (E-2) to facilitate bariatric surgery goals.  Encouraged pt to engage in regular physical activity. One of the most important things you can do for your health. Being physically active can improve your brain health, help manage weight, reduce the risk of disease, strengthen bones and muscles, and improve your ability to do everyday activities. Encouraged patient to avoid sugar sweetened beverages, and avoid drinking with meals and snacks.  Pre-Op Goals Progress & New Goals Continue: Increase Physical Activity; an important part of a healthy lifestyle so keep it moving! The goal is to reach 150 minutes of exercise per week, including cardiovascular and weight baring activity. Continue: Avoid all carbonated beverages (ex: soda, sparkling beverages) Continue: Avoid all sugar-sweetened beverages (ex: regular soda, sports drinks); try alkaline water. Continue: Practice not drinking with meal and snacks  Handouts Provided Include    Learning Style & Readiness for Change Teaching method utilized: Visual & Auditory  Demonstrated degree of understanding via: Teach Back  Readiness Level: contemplative Barriers to learning/adherence to lifestyle change: habit of sugar sweetened beverages  RD's Notes for next Visit  Patient progress toward chosen goals.  MONITORING & EVALUATION Dietary intake, weekly physical activity, body weight, and pre-op goals in 1 month.   Next Steps  Pt has completed visits. No further supervised visits required/recomended  Patient is to return to [facility] for Pre-Op class.

## 2022-05-12 ENCOUNTER — Encounter: Payer: 59 | Attending: General Surgery | Admitting: Skilled Nursing Facility1

## 2022-05-12 ENCOUNTER — Encounter: Payer: Self-pay | Admitting: Skilled Nursing Facility1

## 2022-05-12 VITALS — Wt 298.0 lb

## 2022-05-12 DIAGNOSIS — E119 Type 2 diabetes mellitus without complications: Secondary | ICD-10-CM | POA: Insufficient documentation

## 2022-05-12 DIAGNOSIS — E669 Obesity, unspecified: Secondary | ICD-10-CM | POA: Diagnosis present

## 2022-05-12 NOTE — Progress Notes (Signed)
Pre-Operative Nutrition Class:    Patient was seen on 05/12/2022 for Pre-Operative Bariatric Surgery Education [facility]    Surgery date:  Surgery type: RYGB Start weight at [facility]: 293.4 Weight today: 298  The following the learning objectives were met by the patient during this course: Identify Pre-Op Dietary Goals and will begin 2 weeks pre-operatively Identify appropriate sources of fluids and proteins  State protein recommendations and appropriate sources pre and post-operatively Identify Post-Operative Dietary Goals and will follow for 2 weeks post-operatively Identify appropriate multivitamin and calcium sources Describe the need for physical activity post-operatively and will follow MD recommendations State when to call healthcare provider regarding medication questions or post-operative complications When having a diagnosis of diabetes understanding hypoglycemia symptoms and the inclusion of 1 complex carbohydrate per meal  Handouts given during class include: Pre-Op Bariatric Surgery Diet Handout Protein Shake Handout Post-Op Bariatric Surgery Nutrition Handout BELT Program Information Flyer Support Group Information Flyer [facility] Bariatric Supplements Price List  Follow-Up Plan: Patient will follow-up at [facility] 2 weeks post operatively for diet advancement per MD.

## 2022-05-22 NOTE — Progress Notes (Signed)
Surgery orders requested via Epic inbox. °

## 2022-05-26 ENCOUNTER — Ambulatory Visit: Payer: Self-pay | Admitting: General Surgery

## 2022-05-26 DIAGNOSIS — E119 Type 2 diabetes mellitus without complications: Secondary | ICD-10-CM

## 2022-05-26 NOTE — Patient Instructions (Signed)
DUE TO COVID-19 ONLY TWO VISITORS  (aged 50 and older)  ARE ALLOWED TO COME WITH YOU AND STAY IN THE WAITING ROOM ONLY DURING PRE OP AND PROCEDURE.   **NO VISITORS ARE ALLOWED IN THE SHORT STAY AREA OR RECOVERY ROOM!!**  IF YOU WILL BE ADMITTED INTO THE HOSPITAL YOU ARE ALLOWED ONLY FOUR SUPPORT PEOPLE DURING VISITATION HOURS ONLY (7 AM -8PM)   The support person(s) must pass our screening, gel in and out, and wear a mask at all times, including in the patient's room. Patients must also wear a mask when staff or their support person are in the room. Visitors GUEST BADGE MUST BE WORN VISIBLY  One adult visitor may remain with you overnight and MUST be in the room by 8 P.M.     Your procedure is scheduled on: 06/09/22   Report to [facility] Main Entrance    Report to admitting at  8:45 AM   Call this number if you have problems the morning of surgery [phone]  NO SOLID FOOD AFTER 6:00 PM THE NIGHT BEFORE YOUR SURGERY.   YOU MAY DRINK CLEAR FLUIDS THE MORNING OF SURGERY UNTIL:  8:00 AM  DAY OF SURGERY. SEE CLEAR LIQUID DIET BELOW.   PAIN IS EXPECTED AFTER SURGERY AND WILL NOT BE COMPLETELY ELIMINATED.   AMBULATION AND TYLENOL WILL HELP REDUCE INCISIONAL AND GAS PAIN. MOVEMENT IS KEY!  YOU ARE EXPECTED TO BE OUT OF BED WITHIN 4 HOURS OF ADMISSION TO YOUR PATIENT ROOM.  SITTING IN THE RECLINER THROUGHOUT THE DAY IS IMPORTANT FOR DRINKING FLUIDS AND MOVING GAS THROUGHOUT THE GI TRACT.  COMPRESSION STOCKINGS SHOULD BE WORN [place] UNLESS YOU ARE WALKING.   INCENTIVE SPIROMETER SHOULD BE USED EVERY HOUR WHILE AWAKE TO DECREASE POST-OPERATIVE COMPLICATIONS SUCH AS PNEUMONIA.  WHEN DISCHARGED HOME, IT IS IMPORTANT TO CONTINUE TO WALK EVERY HOUR AND USE THE INCENTIVE SPIROMETER EVERY HOUR.   CLEAR LIQUID DIET  :             Water Black Coffee (sugar ok, NO MILK/CREAM OR CREAMERS)  Tea (sugar ok, NO MILK/CREAM OR CREAMERS) regular and decaf                              Plain Jell-O (NO RED)                                           Fruit ices (not with fruit pulp, NO RED)                                     Popsicles (NO RED)                                                                  Juice: apple, WHITE grape, WHITE cranberry Sports drinks like Gatorade (NO RED)                   The day of surgery:  Drink ONE (1)  G2 at 7:30 AM the  morning of surgery. Drink in one sitting. Do not sip.  This drink was given to you during your hospital  pre-op appointment visit. Nothing else to drink after completing the  G2. At 8:00 am          If you have questions, please contact your surgeon's office.     Oral Hygiene is also important to reduce your risk of infection.                                    Remember - BRUSH YOUR TEETH THE MORNING OF SURGERY WITH YOUR REGULAR TOOTHPASTE  DENTURES WILL BE REMOVED PRIOR TO SURGERY PLEASE DO NOT APPLY "Poly grip" OR ADHESIVES!!!   Do NOT smoke after Midnight   Take these medicines the morning of surgery with A SIP OF WATER: Levothyroxine  Bring CPAP mask and tubing day of surgery.                              You may not have any metal on your body including jewelry, and body piercing             Do not wear lotions, powders, perfumes/cologne, or deodorant                 Men may shave face and neck.   Do not bring valuables to the hospital. Keys.   Contacts, glasses, or bridgework may not be worn into surgery.   Bring small overnight bag day of surgery.   DO NOT [place]. PHARMACY WILL DISPENSE MEDICATIONS LISTED ON YOUR MEDICATION LIST TO YOU DURING YOUR ADMISSION [place]!       Special Instructions: Bring a copy of your healthcare power of attorney and living will documents the day of surgery if you haven't scanned them before.              Please read over the following fact  sheets you were given: IF YOU HAVE QUESTIONS ABOUT YOUR PRE-OP INSTRUCTIONS PLEASE CALL [phone]    [facility] - Preparing for Surgery Before surgery, you can play an important role.  Because skin is not sterile, your skin needs to be as free of germs as possible.  You can reduce the number of germs on your skin by washing with CHG (chlorahexidine gluconate) soap before surgery.  CHG is an antiseptic cleaner which kills germs and bonds with the skin to continue killing germs even after washing. Please DO NOT use if you have an allergy to CHG or antibacterial soaps.  If your skin becomes reddened/irritated stop using the CHG and inform your nurse when you arrive at Short Stay.  You may shave your face/neck. Please follow these instructions carefully:  1.  Shower with CHG Soap the night before surgery and the  morning of Surgery.  2.  If you choose to wash your hair, wash your hair first as usual with your  normal  shampoo.  3.  After you shampoo, rinse your hair and body thoroughly to remove the  shampoo.                            4.  Use  CHG as you would any other liquid soap.  You can apply chg directly  to the skin and wash                       Gently with a scrungie or clean washcloth.  5.  Apply the CHG Soap to your body ONLY FROM THE NECK DOWN.   Do not use on face/ open                           Wound or open sores. Avoid contact with eyes, ears mouth and genitals (private parts).                       Wash face,  Genitals (private parts) with your normal soap.             6.  Wash thoroughly, paying special attention to the area where your surgery  will be performed.  7.  Thoroughly rinse your body with warm water from the neck down.  8.  DO NOT shower/wash with your normal soap after using and rinsing off  the CHG Soap.             9.  Pat yourself dry with a clean towel.            10.  Wear clean pajamas.            11.  Place clean sheets on your bed the night of your first  shower and do not  sleep with pets. Day of Surgery : Do not apply any lotions/deodorants the morning of surgery.  Please wear clean clothes to the hospital/surgery center.  FAILURE TO FOLLOW THESE INSTRUCTIONS MAY RESULT IN THE CANCELLATION OF YOUR SURGERY   PATIENT SIGNATURE_________________________________    ________________________________________________________________________  Adam Phenix  An incentive spirometer is a tool that can help keep your lungs clear and active. This tool measures how well you are filling your lungs with each breath. Taking long deep breaths may help reverse or decrease the chance of developing breathing (pulmonary) problems (especially infection) following: A long period of time when you are unable to move or be active. BEFORE THE PROCEDURE  If the spirometer includes an indicator to show your best effort, your nurse or respiratory therapist will set it to a desired goal. If possible, sit up straight or lean slightly forward. Try not to slouch. Hold the incentive spirometer in an upright position. INSTRUCTIONS FOR USE  Sit on the edge of your bed if possible, or sit up as far as you can in bed or on a chair. Hold the incentive spirometer in an upright position. Breathe out normally. Place the mouthpiece in your mouth and seal your lips tightly around it. Breathe in slowly and as deeply as possible, raising the piston or the ball toward the top of the column. Hold your breath for 3-5 seconds or for as long as possible. Allow the piston or ball to fall to the bottom of the column. Remove the mouthpiece from your mouth and breathe out normally. Rest for a few seconds and repeat Steps 1 through 7 at least 10 times every 1-2 hours when you are awake. Take your time and take a few normal breaths between deep breaths. The spirometer may include an indicator to show your best effort. Use the indicator as a goal to work toward during each  repetition. After each set  of 10 deep breaths, practice coughing to be sure your lungs are clear. If you have an incision (the cut made at the time of surgery), support your incision when coughing by placing a pillow or rolled up towels firmly against it. Once you are able to get out of bed, walk around indoors and cough well. You may stop using the incentive spirometer when instructed by your caregiver.  RISKS AND COMPLICATIONS Take your time so you do not get dizzy or light-headed. If you are in pain, you may need to take or ask for pain medication before doing incentive spirometry. It is harder to take a deep breath if you are having pain. AFTER USE Rest and breathe slowly and easily. It can be helpful to keep track of a log of your progress. Your caregiver can provide you with a simple table to help with this. If you are using the spirometer at home, follow these instructions: [place] IF:  You are having difficultly using the spirometer. You have trouble using the spirometer as often as instructed. Your pain medication is not giving enough relief while using the spirometer. You develop fever of 100.5 F (38.1 C) or higher. SEEK IMMEDIATE MEDICAL CARE IF:  You cough up bloody sputum that had not been present before. You develop fever of 102 F (38.9 C) or greater. You develop worsening pain at or near the incision site. MAKE SURE YOU:  Understand these instructions. Will watch your condition. Will get help right away if you are not doing well or get worse. Document Released: 07/21/2006 Document Revised: 06/02/2011 Document Reviewed: 09/21/2006 [facility] Patient Information 2014 [place], [place].   ________________________________________________________________________

## 2022-05-29 ENCOUNTER — Encounter (HOSPITAL_COMMUNITY)
Admission: RE | Admit: 2022-05-29 | Discharge: 2022-05-29 | Disposition: A | Discharge status: Home / Self Care | Payer: 59 | Source: Ambulatory Visit | Attending: General Surgery | Admitting: General Surgery

## 2022-05-29 ENCOUNTER — Encounter (HOSPITAL_COMMUNITY): Payer: Self-pay

## 2022-05-29 ENCOUNTER — Other Ambulatory Visit: Payer: Self-pay

## 2022-05-29 DIAGNOSIS — Z01812 Encounter for preprocedural laboratory examination: Secondary | ICD-10-CM | POA: Insufficient documentation

## 2022-05-29 DIAGNOSIS — E119 Type 2 diabetes mellitus without complications: Secondary | ICD-10-CM | POA: Insufficient documentation

## 2022-05-29 DIAGNOSIS — Z6841 Body Mass Index (BMI) 40.0 and over, adult: Secondary | ICD-10-CM | POA: Insufficient documentation

## 2022-05-29 LAB — GLUCOSE, CAPILLARY: Glucose-Capillary: 122 mg/dL — ABNORMAL HIGH (ref 70–99)

## 2022-05-29 LAB — CBC WITH DIFFERENTIAL/PLATELET
Abs Immature Granulocytes: 0 10*3/uL (ref 0.00–0.07)
Basophils Absolute: 0 10*3/uL (ref 0.0–0.1)
Basophils Relative: 1 %
Eosinophils Absolute: 0.2 10*3/uL (ref 0.0–0.5)
Eosinophils Relative: 7 %
HCT: 43.7 % (ref 39.0–52.0)
Hemoglobin: 13.9 g/dL (ref 13.0–17.0)
Immature Granulocytes: 0 %
Lymphocytes Relative: 37 %
Lymphs Abs: 1.3 10*3/uL (ref 0.7–4.0)
MCH: 26.2 pg (ref 26.0–34.0)
MCHC: 31.8 g/dL (ref 30.0–36.0)
MCV: 82.3 fL (ref 80.0–100.0)
Monocytes Absolute: 0.8 10*3/uL (ref 0.1–1.0)
Monocytes Relative: 25 %
Neutro Abs: 1 10*3/uL — ABNORMAL LOW (ref 1.7–7.7)
Neutrophils Relative %: 30 %
Platelets: 245 10*3/uL (ref 150–400)
RBC: 5.31 MIL/uL (ref 4.22–5.81)
RDW: 14.1 % (ref 11.5–15.5)
WBC: 3.4 10*3/uL — ABNORMAL LOW (ref 4.0–10.5)
nRBC: 0 % (ref 0.0–0.2)

## 2022-05-29 LAB — COMPREHENSIVE METABOLIC PANEL
ALT: 40 U/L (ref 0–44)
AST: 33 U/L (ref 15–41)
Albumin: 4.2 g/dL (ref 3.5–5.0)
Alkaline Phosphatase: 72 U/L (ref 38–126)
Anion gap: 9 (ref 5–15)
BUN: 9 mg/dL (ref 6–20)
CO2: 26 mmol/L (ref 22–32)
Calcium: 8.5 mg/dL — ABNORMAL LOW (ref 8.9–10.3)
Chloride: 104 mmol/L (ref 98–111)
Creatinine, Ser: 1.22 mg/dL (ref 0.61–1.24)
GFR, Estimated: >60 mL/min (ref >60)
Glucose, Bld: 97 mg/dL (ref 70–99)
Potassium: 3.9 mmol/L (ref 3.5–5.1)
Sodium: 139 mmol/L (ref 135–145)
Total Bilirubin: 0.7 mg/dL (ref 0.3–1.2)
Total Protein: 7.8 g/dL (ref 6.5–8.1)

## 2022-05-29 LAB — TYPE AND SCREEN
ABO/RH(D): O POS
Antibody Screen: NEGATIVE

## 2022-05-29 NOTE — Progress Notes (Signed)
Anesthesia note:  Bowel prep reminder:  NA  PCP - Dorothyann Peng NP Cardiologist -none Other-   Chest x-ray - 02/14/22-epic EKG - 02/12/22-epic Stress Test - no ECHO - 2019-epic Cardiac Cath - no CABG-no Pacemaker/ICD device last checked:NA  Sleep Study - yes negative CPAP - no  Pt is pre diabetic-yes  CBG at PAT visit-122 Fasting Blood Sugar at home-Pt doesn't test Checks Blood Sugar _no____  Blood Thinner:no Blood Thinner Instructions: Aspirin Instructions: Last Dose:  Anesthesia review: Yes / reason:BP  Patient denies shortness of breath, fever, cough and chest pain at PAT appointment. Pt reports that he doesn't get SOB with activities. His BP does get elevated at the Dr's office. It was 155/107 and 150/99 at the PAT visit. I told him to monitor it at home eat less salt and call his PCP if it stays elevated.   Patient verbalized understanding of instructions that were given to them at the PAT appointment. Patient was also instructed that they will need to review over the PAT instructions again at home before surgery.yes

## 2022-05-30 LAB — HEMOGLOBIN A1C
Hgb A1c MFr Bld: 6.5 % — ABNORMAL HIGH (ref 4.8–5.6)
Mean Plasma Glucose: 140 mg/dL

## 2022-06-09 ENCOUNTER — Encounter (HOSPITAL_COMMUNITY): Payer: Self-pay | Admitting: General Surgery

## 2022-06-09 ENCOUNTER — Ambulatory Visit (HOSPITAL_COMMUNITY): Payer: 59 | Admitting: Certified Registered Nurse Anesthetist

## 2022-06-09 ENCOUNTER — Other Ambulatory Visit: Payer: Self-pay

## 2022-06-09 ENCOUNTER — Observation Stay (HOSPITAL_COMMUNITY)
Admission: AD | Admit: 2022-06-09 | Discharge: 2022-06-11 | Disposition: A | Discharge status: Home / Self Care | Payer: 59 | Attending: General Surgery | Admitting: General Surgery

## 2022-06-09 ENCOUNTER — Encounter (HOSPITAL_COMMUNITY)
Admission: AD | Disposition: A | Discharge status: Home / Self Care | Payer: Self-pay | Source: Home / Self Care | Attending: General Surgery

## 2022-06-09 DIAGNOSIS — E1169 Type 2 diabetes mellitus with other specified complication: Secondary | ICD-10-CM | POA: Diagnosis not present

## 2022-06-09 DIAGNOSIS — Z880 Allergy status to penicillin: Secondary | ICD-10-CM | POA: Diagnosis not present

## 2022-06-09 DIAGNOSIS — I10 Essential (primary) hypertension: Secondary | ICD-10-CM

## 2022-06-09 DIAGNOSIS — M159 Polyosteoarthritis, unspecified: Secondary | ICD-10-CM | POA: Diagnosis present

## 2022-06-09 DIAGNOSIS — E786 Lipoprotein deficiency: Secondary | ICD-10-CM | POA: Diagnosis not present

## 2022-06-09 DIAGNOSIS — Z6839 Body mass index (BMI) 39.0-39.9, adult: Secondary | ICD-10-CM | POA: Insufficient documentation

## 2022-06-09 DIAGNOSIS — E119 Type 2 diabetes mellitus without complications: Secondary | ICD-10-CM

## 2022-06-09 DIAGNOSIS — Z79890 Hormone replacement therapy (postmenopausal): Secondary | ICD-10-CM | POA: Diagnosis not present

## 2022-06-09 DIAGNOSIS — Z91013 Allergy to seafood: Secondary | ICD-10-CM

## 2022-06-09 DIAGNOSIS — Z8249 Family history of ischemic heart disease and other diseases of the circulatory system: Secondary | ICD-10-CM | POA: Diagnosis not present

## 2022-06-09 DIAGNOSIS — Z9884 Bariatric surgery status: Secondary | ICD-10-CM

## 2022-06-09 DIAGNOSIS — K760 Fatty (change of) liver, not elsewhere classified: Secondary | ICD-10-CM | POA: Insufficient documentation

## 2022-06-09 DIAGNOSIS — Z79899 Other long term (current) drug therapy: Secondary | ICD-10-CM | POA: Insufficient documentation

## 2022-06-09 DIAGNOSIS — E039 Hypothyroidism, unspecified: Secondary | ICD-10-CM | POA: Diagnosis present

## 2022-06-09 DIAGNOSIS — Z833 Family history of diabetes mellitus: Secondary | ICD-10-CM | POA: Diagnosis not present

## 2022-06-09 DIAGNOSIS — E063 Autoimmune thyroiditis: Principal | ICD-10-CM | POA: Insufficient documentation

## 2022-06-09 DIAGNOSIS — K660 Peritoneal adhesions (postprocedural) (postinfection): Secondary | ICD-10-CM | POA: Diagnosis present

## 2022-06-09 DIAGNOSIS — Z888 Allergy status to other drugs, medicaments and biological substances status: Secondary | ICD-10-CM

## 2022-06-09 DIAGNOSIS — Z6841 Body Mass Index (BMI) 40.0 and over, adult: Secondary | ICD-10-CM

## 2022-06-09 HISTORY — PX: GASTRIC ROUX-EN-Y: SHX5262

## 2022-06-09 LAB — CBC
HCT: 40.8 % (ref 39.0–52.0)
Hemoglobin: 13.3 g/dL (ref 13.0–17.0)
MCH: 26.8 pg (ref 26.0–34.0)
MCHC: 32.6 g/dL (ref 30.0–36.0)
MCV: 82.1 fL (ref 80.0–100.0)
Platelets: 274 10*3/uL (ref 150–400)
RBC: 4.97 MIL/uL (ref 4.22–5.81)
RDW: 14.3 % (ref 11.5–15.5)
WBC: 11.3 10*3/uL — ABNORMAL HIGH (ref 4.0–10.5)
nRBC: 0 % (ref 0.0–0.2)

## 2022-06-09 LAB — HEMOGLOBIN AND HEMATOCRIT, BLOOD
HCT: 41.7 % (ref 39.0–52.0)
Hemoglobin: 13.4 g/dL (ref 13.0–17.0)

## 2022-06-09 LAB — ABO/RH: ABO/RH(D): O POS

## 2022-06-09 LAB — GLUCOSE, CAPILLARY
Glucose-Capillary: 114 mg/dL — ABNORMAL HIGH (ref 70–99)
Glucose-Capillary: 182 mg/dL — ABNORMAL HIGH (ref 70–99)
Glucose-Capillary: 175 mg/dL — ABNORMAL HIGH (ref 70–99)

## 2022-06-09 SURGERY — LAPAROSCOPIC ROUX-EN-Y GASTRIC BYPASS WITH UPPER ENDOSCOPY
Anesthesia: General

## 2022-06-09 MED ORDER — SODIUM CHLORIDE 0.9 % IV SOLN: 12.5000 mg | Freq: Four times a day (QID) | INTRAVENOUS | Status: DC | PRN

## 2022-06-09 MED ORDER — ONDANSETRON HCL 4 MG/2ML IJ SOLN
INTRAMUSCULAR | Status: DC | PRN
  Administered 2022-06-09: 4 mg via INTRAVENOUS

## 2022-06-09 MED ORDER — DEXAMETHASONE SODIUM PHOSPHATE 10 MG/ML IJ SOLN
INTRAMUSCULAR | Status: DC | PRN
  Administered 2022-06-09: 10 mg via INTRAVENOUS

## 2022-06-09 MED ORDER — DEXAMETHASONE SODIUM PHOSPHATE 10 MG/ML IJ SOLN
INTRAMUSCULAR | Status: AC
  Filled 2022-06-09: qty 1

## 2022-06-09 MED ORDER — LACTATED RINGERS IR SOLN
Status: DC | PRN
  Administered 2022-06-09: 1000 mL

## 2022-06-09 MED ORDER — HYDROMORPHONE HCL 1 MG/ML IJ SOLN
0.2500 mg | INTRAMUSCULAR | Status: DC | PRN
  Administered 2022-06-09 (×2): 0.5 mg via INTRAVENOUS

## 2022-06-09 MED ORDER — MIDAZOLAM HCL 2 MG/2ML IJ SOLN: 0.5000 mg | Freq: Once | INTRAMUSCULAR | Status: DC | PRN

## 2022-06-09 MED ORDER — BUPIVACAINE LIPOSOME 1.3 % IJ SUSP: 20.0000 mL | Freq: Once | INTRAMUSCULAR | Status: DC

## 2022-06-09 MED ORDER — OXYCODONE HCL 5 MG/5ML PO SOLN: 5.0000 mg | Freq: Once | ORAL | Status: DC | PRN

## 2022-06-09 MED ORDER — MIDAZOLAM HCL 5 MG/5ML IJ SOLN
INTRAMUSCULAR | Status: DC | PRN
  Administered 2022-06-09: 2 mg via INTRAVENOUS

## 2022-06-09 MED ORDER — ACETAMINOPHEN 500 MG PO TABS
1000.0000 mg | ORAL_TABLET | Freq: Three times a day (TID) | ORAL | Status: DC
  Administered 2022-06-09 – 2022-06-11 (×5): 1000 mg via ORAL
  Filled 2022-06-09 (×5): qty 2

## 2022-06-09 MED ORDER — ONDANSETRON HCL 4 MG/2ML IJ SOLN: 4.0000 mg | Freq: Four times a day (QID) | INTRAMUSCULAR | Status: DC | PRN

## 2022-06-09 MED ORDER — HEPARIN SODIUM (PORCINE) 5000 UNIT/ML IJ SOLN
5000.0000 [IU] | INTRAMUSCULAR | Status: AC
  Administered 2022-06-09: 5000 [IU] via SUBCUTANEOUS
  Filled 2022-06-09: qty 1

## 2022-06-09 MED ORDER — OXYCODONE HCL 5 MG PO TABS: 5.0000 mg | ORAL_TABLET | Freq: Once | ORAL | Status: DC | PRN

## 2022-06-09 MED ORDER — CHLORHEXIDINE GLUCONATE 4 % EX LIQD: Freq: Once | CUTANEOUS | Status: DC

## 2022-06-09 MED ORDER — LOSARTAN POTASSIUM 50 MG PO TABS
100.0000 mg | ORAL_TABLET | Freq: Every day | ORAL | Status: DC
  Administered 2022-06-09 – 2022-06-11 (×3): 100 mg via ORAL
  Filled 2022-06-09 (×3): qty 2

## 2022-06-09 MED ORDER — KETAMINE HCL 50 MG/5ML IJ SOSY
PREFILLED_SYRINGE | INTRAMUSCULAR | Status: AC
  Filled 2022-06-09: qty 5

## 2022-06-09 MED ORDER — ROCURONIUM BROMIDE 10 MG/ML (PF) SYRINGE
PREFILLED_SYRINGE | INTRAVENOUS | Status: DC | PRN
  Administered 2022-06-09: 20 mg via INTRAVENOUS
  Administered 2022-06-09: 100 mg via INTRAVENOUS

## 2022-06-09 MED ORDER — DEXAMETHASONE SODIUM PHOSPHATE 4 MG/ML IJ SOLN: 4.0000 mg | INTRAMUSCULAR | Status: DC

## 2022-06-09 MED ORDER — PHENYLEPHRINE 80 MCG/ML (10ML) SYRINGE FOR IV PUSH (FOR BLOOD PRESSURE SUPPORT)
PREFILLED_SYRINGE | INTRAVENOUS | Status: AC
  Filled 2022-06-09: qty 10

## 2022-06-09 MED ORDER — ACETAMINOPHEN 500 MG PO TABS
1000.0000 mg | ORAL_TABLET | Freq: Once | ORAL | Status: AC
  Administered 2022-06-09: 1000 mg via ORAL
  Filled 2022-06-09: qty 2

## 2022-06-09 MED ORDER — LIP MEDEX EX OINT: TOPICAL_OINTMENT | CUTANEOUS | Status: DC | PRN

## 2022-06-09 MED ORDER — ROCURONIUM BROMIDE 10 MG/ML (PF) SYRINGE
PREFILLED_SYRINGE | INTRAVENOUS | Status: AC
  Filled 2022-06-09: qty 10

## 2022-06-09 MED ORDER — FIBRIN SEALANT 2 ML SINGLE DOSE KIT
2.0000 mL | PACK | Freq: Once | CUTANEOUS | Status: AC
  Administered 2022-06-09: 2 mL via TOPICAL
  Filled 2022-06-09: qty 2

## 2022-06-09 MED ORDER — LIDOCAINE 2% (20 MG/ML) 5 ML SYRINGE
INTRAMUSCULAR | Status: DC | PRN
  Administered 2022-06-09: 60 mg via INTRAVENOUS
  Administered 2022-06-09: 1.5 mg/kg/h via INTRAVENOUS

## 2022-06-09 MED ORDER — SIMETHICONE 80 MG PO CHEW
80.0000 mg | CHEWABLE_TABLET | Freq: Four times a day (QID) | ORAL | Status: DC | PRN
  Administered 2022-06-09 – 2022-06-10 (×3): 80 mg via ORAL
  Filled 2022-06-09 (×3): qty 1

## 2022-06-09 MED ORDER — POTASSIUM CHLORIDE IN NACL 20-0.45 MEQ/L-% IV SOLN
INTRAVENOUS | Status: DC
  Filled 2022-06-09 (×5): qty 1000

## 2022-06-09 MED ORDER — PROPOFOL 10 MG/ML IV BOLUS
INTRAVENOUS | Status: AC
  Filled 2022-06-09: qty 20

## 2022-06-09 MED ORDER — BUPIVACAINE LIPOSOME 1.3 % IJ SUSP
INTRAMUSCULAR | Status: AC
  Filled 2022-06-09: qty 20

## 2022-06-09 MED ORDER — EPHEDRINE 5 MG/ML INJ
INTRAVENOUS | Status: AC
  Filled 2022-06-09: qty 5

## 2022-06-09 MED ORDER — LIDOCAINE HCL (PF) 2 % IJ SOLN
INTRAMUSCULAR | Status: AC
  Filled 2022-06-09: qty 10

## 2022-06-09 MED ORDER — LIDOCAINE HCL (PF) 2 % IJ SOLN
INTRAMUSCULAR | Status: AC
  Filled 2022-06-09: qty 5

## 2022-06-09 MED ORDER — ACETAMINOPHEN 500 MG PO TABS: 1000.0000 mg | ORAL_TABLET | ORAL | Status: DC

## 2022-06-09 MED ORDER — PANTOPRAZOLE SODIUM 40 MG IV SOLR
40.0000 mg | Freq: Every day | INTRAVENOUS | Status: DC
  Administered 2022-06-09 – 2022-06-10 (×2): 40 mg via INTRAVENOUS
  Filled 2022-06-09 (×2): qty 10

## 2022-06-09 MED ORDER — SCOPOLAMINE 1 MG/3DAYS TD PT72
1.0000 | MEDICATED_PATCH | TRANSDERMAL | Status: DC
  Administered 2022-06-09: 1.5 mg via TRANSDERMAL
  Filled 2022-06-09: qty 1

## 2022-06-09 MED ORDER — MIDAZOLAM HCL 2 MG/2ML IJ SOLN
INTRAMUSCULAR | Status: AC
  Filled 2022-06-09: qty 2

## 2022-06-09 MED ORDER — PHENYLEPHRINE HCL (PRESSORS) 10 MG/ML IV SOLN
INTRAVENOUS | Status: DC | PRN
  Administered 2022-06-09 (×5): 80 ug via INTRAVENOUS

## 2022-06-09 MED ORDER — SUGAMMADEX SODIUM 200 MG/2ML IV SOLN
INTRAVENOUS | Status: DC | PRN
  Administered 2022-06-09: 400 mg via INTRAVENOUS

## 2022-06-09 MED ORDER — FENTANYL CITRATE (PF) 250 MCG/5ML IJ SOLN
INTRAMUSCULAR | Status: AC
  Filled 2022-06-09: qty 5

## 2022-06-09 MED ORDER — ONDANSETRON HCL 4 MG/2ML IJ SOLN
INTRAMUSCULAR | Status: AC
  Filled 2022-06-09: qty 2

## 2022-06-09 MED ORDER — SUGAMMADEX SODIUM 500 MG/5ML IV SOLN
INTRAVENOUS | Status: AC
  Filled 2022-06-09: qty 5

## 2022-06-09 MED ORDER — ORAL CARE MOUTH RINSE: 15.0000 mL | Freq: Once | OROMUCOSAL | Status: AC

## 2022-06-09 MED ORDER — MEPERIDINE HCL 50 MG/ML IJ SOLN: 6.2500 mg | INTRAMUSCULAR | Status: DC | PRN

## 2022-06-09 MED ORDER — ACETAMINOPHEN 160 MG/5ML PO SOLN
1000.0000 mg | Freq: Three times a day (TID) | ORAL | Status: DC
  Filled 2022-06-09: qty 40.6

## 2022-06-09 MED ORDER — EPHEDRINE SULFATE-NACL 50-0.9 MG/10ML-% IV SOSY
PREFILLED_SYRINGE | INTRAVENOUS | Status: DC | PRN
  Administered 2022-06-09 (×2): 5 mg via INTRAVENOUS

## 2022-06-09 MED ORDER — HEPARIN SODIUM (PORCINE) 5000 UNIT/ML IJ SOLN
5000.0000 [IU] | Freq: Three times a day (TID) | INTRAMUSCULAR | Status: DC
  Administered 2022-06-09 – 2022-06-11 (×5): 5000 [IU] via SUBCUTANEOUS
  Filled 2022-06-09 (×5): qty 1

## 2022-06-09 MED ORDER — BUPIVACAINE-EPINEPHRINE (PF) 0.25% -1:200000 IJ SOLN
INTRAMUSCULAR | Status: AC
  Filled 2022-06-09: qty 30

## 2022-06-09 MED ORDER — PHENYLEPHRINE HCL-NACL 20-0.9 MG/250ML-% IV SOLN
INTRAVENOUS | Status: DC | PRN
  Administered 2022-06-09: 40 ug/min via INTRAVENOUS

## 2022-06-09 MED ORDER — PROPOFOL 10 MG/ML IV BOLUS
INTRAVENOUS | Status: DC | PRN
  Administered 2022-06-09: 200 mg via INTRAVENOUS

## 2022-06-09 MED ORDER — HYDROMORPHONE HCL 1 MG/ML IJ SOLN
INTRAMUSCULAR | Status: AC
  Filled 2022-06-09: qty 1

## 2022-06-09 MED ORDER — "VISTASEAL 4 ML SINGLE DOSE KIT "
4.0000 mL | PACK | Freq: Once | CUTANEOUS | Status: AC
  Administered 2022-06-09: 4 mL via TOPICAL
  Filled 2022-06-09: qty 4

## 2022-06-09 MED ORDER — FENTANYL CITRATE (PF) 250 MCG/5ML IJ SOLN
INTRAMUSCULAR | Status: DC | PRN
  Administered 2022-06-09: 50 ug via INTRAVENOUS
  Administered 2022-06-09: 100 ug via INTRAVENOUS
  Administered 2022-06-09: 50 ug via INTRAVENOUS

## 2022-06-09 MED ORDER — HYDRALAZINE HCL 20 MG/ML IJ SOLN: 10.0000 mg | INTRAMUSCULAR | Status: DC | PRN

## 2022-06-09 MED ORDER — PHENYLEPHRINE HCL-NACL 20-0.9 MG/250ML-% IV SOLN: INTRAVENOUS | Status: DC | PRN

## 2022-06-09 MED ORDER — BUPIVACAINE-EPINEPHRINE 0.25% -1:200000 IJ SOLN
INTRAMUSCULAR | Status: DC | PRN
  Administered 2022-06-09: 30 mL

## 2022-06-09 MED ORDER — PROMETHAZINE HCL 25 MG/ML IJ SOLN: 6.2500 mg | INTRAMUSCULAR | Status: DC | PRN

## 2022-06-09 MED ORDER — SODIUM CHLORIDE 0.9 % IV SOLN
2.0000 g | INTRAVENOUS | Status: AC
  Administered 2022-06-09: 2 g via INTRAVENOUS
  Filled 2022-06-09: qty 2

## 2022-06-09 MED ORDER — CHLORHEXIDINE GLUCONATE 0.12 % MT SOLN
15.0000 mL | Freq: Once | OROMUCOSAL | Status: AC
  Administered 2022-06-09: 15 mL via OROMUCOSAL

## 2022-06-09 MED ORDER — INSULIN ASPART 100 UNIT/ML IJ SOLN
0.0000 [IU] | INTRAMUSCULAR | Status: DC
  Administered 2022-06-09 (×2): 4 [IU] via SUBCUTANEOUS
  Administered 2022-06-10: 3 [IU] via SUBCUTANEOUS

## 2022-06-09 MED ORDER — KETAMINE HCL 10 MG/ML IJ SOLN
INTRAMUSCULAR | Status: DC | PRN
  Administered 2022-06-09: 10 mg via INTRAVENOUS
  Administered 2022-06-09 (×2): 20 mg via INTRAVENOUS

## 2022-06-09 MED ORDER — LEVOTHYROXINE SODIUM 50 MCG PO TABS
50.0000 ug | ORAL_TABLET | Freq: Every day | ORAL | Status: DC
  Administered 2022-06-10 – 2022-06-11 (×2): 50 ug via ORAL
  Filled 2022-06-09 (×2): qty 1

## 2022-06-09 MED ORDER — APREPITANT 40 MG PO CAPS
40.0000 mg | ORAL_CAPSULE | ORAL | Status: AC
  Administered 2022-06-09: 40 mg via ORAL
  Filled 2022-06-09: qty 1

## 2022-06-09 MED ORDER — BUPIVACAINE LIPOSOME 1.3 % IJ SUSP
INTRAMUSCULAR | Status: DC | PRN
  Administered 2022-06-09: 20 mL

## 2022-06-09 MED ORDER — LACTATED RINGERS IV SOLN: INTRAVENOUS | Status: DC

## 2022-06-09 MED ORDER — MORPHINE SULFATE (PF) 2 MG/ML IV SOLN: 1.0000 mg | INTRAVENOUS | Status: DC | PRN

## 2022-06-09 MED ORDER — ENSURE MAX PROTEIN PO LIQD
2.0000 [oz_av] | ORAL | Status: DC
  Administered 2022-06-10 – 2022-06-11 (×13): 2 [oz_av] via ORAL

## 2022-06-09 MED ORDER — OXYCODONE HCL 5 MG/5ML PO SOLN
5.0000 mg | Freq: Four times a day (QID) | ORAL | Status: DC | PRN
  Administered 2022-06-09 – 2022-06-10 (×3): 5 mg via ORAL
  Filled 2022-06-09 (×3): qty 5

## 2022-06-09 MED ORDER — FLUTICASONE PROPIONATE 50 MCG/ACT NA SUSP: 1.0000 | Freq: Every day | NASAL | Status: DC | PRN

## 2022-06-09 SURGICAL SUPPLY — 82 items
ANTIFOG SOL W/FOAM PAD STRL (MISCELLANEOUS) ×1
APL LAPSCP 35 DL APL RGD (MISCELLANEOUS) ×2
APL PRP STRL LF DISP 70% ISPRP (MISCELLANEOUS) ×2
APL SWBSTK 6 STRL LF DISP (MISCELLANEOUS)
APPLICATOR COTTON TIP 6 STRL (MISCELLANEOUS) IMPLANT
APPLICATOR COTTON TIP 6IN STRL (MISCELLANEOUS)
APPLICATOR VISTASEAL 35 (MISCELLANEOUS) ×2 IMPLANT
APPLIER CLIP ROT 13.4 12 LRG (CLIP)
APR CLP LRG 13.4X12 ROT 20 MLT (CLIP)
BAG COUNTER SPONGE SURGICOUNT (BAG) IMPLANT
BAG SPNG CNTER NS LX DISP (BAG)
BLADE SURG SZ11 CARB STEEL (BLADE) ×1 IMPLANT
CABLE HIGH FREQUENCY MONO STRZ (ELECTRODE) IMPLANT
CHLORAPREP W/TINT 26 (MISCELLANEOUS) ×2 IMPLANT
CLIP APPLIE ROT 13.4 12 LRG (CLIP) IMPLANT
CLIP SUT LAPRA TY ABSORB (SUTURE) ×2 IMPLANT
CUTTER FLEX LINEAR 45M (STAPLE) IMPLANT
DEVICE SUT QUICK LOAD TK 5 (SUTURE) IMPLANT
DEVICE SUT TI-KNOT TK 5X26 (SUTURE) IMPLANT
DEVICE SUTURE ENDOST 10MM (ENDOMECHANICALS) ×1 IMPLANT
DRAIN PENROSE 0.25X18 (DRAIN) ×1 IMPLANT
DRSG TEGADERM 2-3/8X2-3/4 SM (GAUZE/BANDAGES/DRESSINGS) ×6 IMPLANT
ELECT REM PT RETURN 15FT ADLT (MISCELLANEOUS) ×1 IMPLANT
GAUZE 4X4 16PLY ~~LOC~~+RFID DBL (SPONGE) ×1 IMPLANT
GAUZE SPONGE 2X2 8PLY STRL LF (GAUZE/BANDAGES/DRESSINGS) ×1 IMPLANT
GAUZE SPONGE 4X4 12PLY STRL (GAUZE/BANDAGES/DRESSINGS) IMPLANT
GLOVE BIO SURGEON STRL SZ7.5 (GLOVE) ×1 IMPLANT
GLOVE INDICATOR 8.0 STRL GRN (GLOVE) ×1 IMPLANT
GOWN STRL REUS W/ TWL XL LVL3 (GOWN DISPOSABLE) ×4 IMPLANT
GOWN STRL REUS W/TWL XL LVL3 (GOWN DISPOSABLE) ×4
IRRIG SUCT STRYKERFLOW 2 WTIP (MISCELLANEOUS) ×1
IRRIGATION SUCT STRKRFLW 2 WTP (MISCELLANEOUS) ×1 IMPLANT
KIT BASIN OR (CUSTOM PROCEDURE TRAY) ×1 IMPLANT
KIT GASTRIC LAVAGE 34FR ADT (SET/KITS/TRAYS/PACK) ×1 IMPLANT
KIT TURNOVER KIT A (KITS) IMPLANT
MARKER SKIN DUAL TIP RULER LAB (MISCELLANEOUS) ×1 IMPLANT
MAT PREVALON FULL STRYKER (MISCELLANEOUS) ×1 IMPLANT
NDL SPNL 22GX3.5 QUINCKE BK (NEEDLE) ×1 IMPLANT
NEEDLE SPNL 22GX3.5 QUINCKE BK (NEEDLE) ×1 IMPLANT
PACK CARDIOVASCULAR III (CUSTOM PROCEDURE TRAY) ×1 IMPLANT
RELOAD 45 VASCULAR/THIN (ENDOMECHANICALS) IMPLANT
RELOAD ENDO STITCH 2.0 (ENDOMECHANICALS) ×9
RELOAD STAPLE 45 2.5 WHT GRN (ENDOMECHANICALS) IMPLANT
RELOAD STAPLE 45 3.5 BLU ETS (ENDOMECHANICALS) IMPLANT
RELOAD STAPLE 60 2.6 WHT THN (STAPLE) ×2 IMPLANT
RELOAD STAPLE 60 3.6 BLU REG (STAPLE) ×2 IMPLANT
RELOAD STAPLE 60 3.8 GOLD REG (STAPLE) ×1 IMPLANT
RELOAD STAPLE TA45 3.5 REG BLU (ENDOMECHANICALS) IMPLANT
RELOAD STAPLER BLUE 60MM (STAPLE) ×2 IMPLANT
RELOAD STAPLER GOLD 60MM (STAPLE) ×1 IMPLANT
RELOAD STAPLER WHITE 60MM (STAPLE) ×2 IMPLANT
RELOAD SUT SNGL STCH ABSRB 2-0 (ENDOMECHANICALS) ×5 IMPLANT
RELOAD SUT SNGL STCH BLK 2-0 (ENDOMECHANICALS) ×4 IMPLANT
SCISSORS LAP 5X45 EPIX DISP (ENDOMECHANICALS) ×1 IMPLANT
SET TUBE SMOKE EVAC HIGH FLOW (TUBING) ×1 IMPLANT
SHEARS HARMONIC ACE PLUS 45CM (MISCELLANEOUS) ×1 IMPLANT
SLEEVE ADV FIXATION 12X100MM (TROCAR) ×2 IMPLANT
SLEEVE ADV FIXATION 5X100MM (TROCAR) IMPLANT
SOLUTION ANTFG W/FOAM PAD STRL (MISCELLANEOUS) ×1 IMPLANT
STAPLER ECHELON BIOABSB 60 FLE (MISCELLANEOUS) IMPLANT
STAPLER ECHELON LONG 60 440 (INSTRUMENTS) ×1 IMPLANT
STAPLER RELOAD BLUE 60MM (STAPLE) ×2
STAPLER RELOAD GOLD 60MM (STAPLE) ×1
STAPLER RELOAD WHITE 60MM (STAPLE) ×2
STRIP CLOSURE SKIN 1/2X4 (GAUZE/BANDAGES/DRESSINGS) ×1 IMPLANT
SURGILUBE 2OZ TUBE FLIPTOP (MISCELLANEOUS) ×1 IMPLANT
SUT MNCRL AB 4-0 PS2 18 (SUTURE) ×1 IMPLANT
SUT RELOAD ENDO STITCH 2 48X1 (ENDOMECHANICALS) ×5
SUT RELOAD ENDO STITCH 2.0 (ENDOMECHANICALS) ×4
SUT SURGIDAC NAB ES-9 0 48 120 (SUTURE) IMPLANT
SUT VIC AB 2-0 SH 27 (SUTURE) ×1
SUT VIC AB 2-0 SH 27X BRD (SUTURE) ×1 IMPLANT
SUTURE RELOAD END STTCH 2 48X1 (ENDOMECHANICALS) ×5 IMPLANT
SUTURE RELOAD ENDO STITCH 2.0 (ENDOMECHANICALS) ×4 IMPLANT
SYR 20ML LL LF (SYRINGE) ×2 IMPLANT
TOWEL OR 17X26 10 PK STRL BLUE (TOWEL DISPOSABLE) ×1 IMPLANT
TOWEL OR NON WOVEN STRL DISP B (DISPOSABLE) ×1 IMPLANT
TRAY FOLEY MTR SLVR 16FR STAT (SET/KITS/TRAYS/PACK) IMPLANT
TROCAR ADV FIXATION 12X100MM (TROCAR) ×1 IMPLANT
TROCAR ADV FIXATION 5X100MM (TROCAR) ×1 IMPLANT
TROCAR XCEL NON-BLD 5MMX100MML (ENDOMECHANICALS) ×1 IMPLANT
TUBING CONNECTING 10 (TUBING) ×2 IMPLANT

## 2022-06-09 NOTE — Transfer of Care (Signed)
Immediate Anesthesia Transfer of Care Note  Patient: Shawn Becker  Procedure(s) Performed: LAPAROSCOPIC ROUX-EN-Y GASTRIC BYPASS WITH UPPER ENDOSCOPY  Patient Location: PACU  Anesthesia Type:General  Level of Consciousness: awake, alert , and oriented  Airway & Oxygen Therapy: Patient Spontanous Breathing and Patient connected to face mask oxygen  Post-op Assessment: Report given to RN and Post -op Vital signs reviewed and stable  Post vital signs: Reviewed and stable  Last Vitals:  Vitals Value Taken Time  BP 136/75 06/09/22 1319  Temp    Pulse 86 06/09/22 1320  Resp    SpO2 100 % 06/09/22 1320  Vitals shown include unvalidated device data.  Last Pain:  Vitals:   06/09/22 0935  TempSrc:   PainSc: 0-No pain         Complications: No notable events documented.

## 2022-06-09 NOTE — Progress Notes (Signed)
PHARMACY CONSULT FOR:  Risk Assessment for Post-Discharge VTE Following Bariatric Surgery  Post-Discharge VTE Risk Assessment: This patient's probability of 30-day post-discharge VTE is increased due to the factors marked:  Sleeve gastrectomy   Liver disorder (transplant, cirrhosis, or nonalcoholic steatohepatitis)   Hx of VTE   Hemorrhage requiring transfusion   GI perforation, leak, or obstruction   ====================================================  X  Male    Age >/=60 years    BMI >/=50 kg/m2    CHF    Dyspnea at Rest    Paraplegia   X Non-gastric-band surgery    Operation Time >/=3 hr    Return to OR     Length of Stay >/= 3 d   Hypercoagulable condition   Significant venous stasis      Predicted probability of 30-day post-discharge VTE: 0.31%  Other patient-specific factors to consider: no  Recommendation for Discharge: No pharmacologic prophylaxis post-discharge  Shawn Becker is a 52 y.o. male who underwent laparoscopic Roux-en-Y gastric bypass 06/09/2022  Allergies  Allergen Reactions   Metformin And Related     Joint Pain    Penicillins Hives   Shellfish Allergy Hives    Patient Measurements: Height: 6' (182.9 cm) Weight: 132.9 kg (293 lb) IBW/kg (Calculated) : 77.6 Body mass index is 39.74 kg/m.  No results for input(s): "WBC", "HGB", "HCT", "PLT", "APTT", "CREATININE", "LABCREA", "CREAT24HRUR", "MG", "PHOS", "ALBUMIN", "PROT", "AST", "ALT", "ALKPHOS", "BILITOT", "BILIDIR", "IBILI" in the last 72 hours. Estimated Creatinine Clearance: 103.3 mL/min (by C-G formula based on SCr of 1.22 mg/dL).    Past Medical History:  Diagnosis Date   Allergy    pollen   Arthritis    Fatty liver    Hypertension    Hypothyroidism    Left ventricular hypertrophy    Pre-diabetes      Medications Prior to Admission  Medication Sig Dispense Refill Last Dose   fluticasone (FLONASE) 50 MCG/ACT nasal spray Place 1 spray into both nostrils daily as  needed for allergies or rhinitis.   Past Week   levothyroxine (SYNTHROID) 50 MCG tablet Take 1 tablet (50 mcg total) by mouth daily before breakfast. 90 tablet 3 06/09/2022 at 0800   losartan (COZAAR) 100 MG tablet Take 1 tablet (100 mg total) by mouth daily. 90 tablet 3 06/08/2022    Eudelia Bunch, Pharm.D Use secure chat for questions 06/09/2022 1:27 PM

## 2022-06-09 NOTE — Progress Notes (Signed)
Discussed QI "Goals for Discharge" document with patient including ambulation in halls, Incentive Spirometry use every hour, and oral care.  Also discussed pain and nausea control.  Enabled or verified head of bed 30 degree alarm activated.  BSTOP education provided including BSTOP information guide, "Guide for Pain Management after your Bariatric Procedure".  Diet progression education provided including "Bariatric Surgery Post-Op Food Plan Phase 1: Liquids".  Questions answered.  Will continue to partner with bedside RN and follow up with patient per protocol.    Thank you,  Haniya Fern Maribella Kuna, RN, MSN Bariatric Nurse Coordinator [phone] (office)  

## 2022-06-09 NOTE — Anesthesia Procedure Notes (Signed)
Procedure Name: Intubation Date/Time: 06/09/2022 10:48 AM  Performed by: Maxwell Caul, CRNAPre-anesthesia Checklist: Patient identified, Emergency Drugs available, Suction available and Patient being monitored Patient Re-evaluated:Patient Re-evaluated prior to induction Oxygen Delivery Method: Circle system utilized Preoxygenation: Pre-oxygenation with 100% oxygen Induction Type: IV induction Ventilation: Mask ventilation without difficulty Laryngoscope Size: Mac and 4 Grade View: Grade I Tube type: Oral Tube size: 7.5 mm Number of attempts: 1 Airway Equipment and Method: Stylet Placement Confirmation: ETT inserted through vocal cords under direct vision, positive ETCO2 and breath sounds checked- equal and bilateral Secured at: 21 cm Tube secured with: Tape Dental Injury: Teeth and Oropharynx as per pre-operative assessment

## 2022-06-09 NOTE — Op Note (Signed)
DARALD BRACCIA [id number] 04-Dec-1970. 06/09/2022  Preoperative diagnosis:  Essential hypertension  Hashimoto's thyroiditis  Diabetes mellitus type 2 in obese  Primary osteoarthritis involving multiple joints  Fatty liver  Severe obesity (BMI 39.7)  Low HDL (under 40)   Postoperative  diagnosis:  1. same  Surgical procedure: Laparoscopic Roux-en-Y gastric bypass (ante-colic, ante-gastric); upper endoscopy  Surgeon: Gayland Curry, M.D. FACS  Asst.: Romana Juniper MD FACS  Anesthesia: General plus exparel/marcaine mix  Complications: None   EBL: Minimal   Drains: None   Disposition: PACU in good condition   Indications for procedure: 52 y.o. yo male with morbid obesity who has been unsuccessful at sustained weight loss. The patient's comorbidities are listed above. We discussed the risk and benefits of surgery including but not limited to anesthesia risk, bleeding, infection, blood clot formation, anastomotic leak, anastomotic stricture, ulcer formation, death, respiratory complications, intestinal blockage, internal hernia, gallstone formation, vitamin and nutritional deficiencies, injury to surrounding structures, failure to lose weight and mood changes.   Description of procedure: Patient is brought to the operating room and general anesthesia induced. The patient had received preoperative broad-spectrum IV antibiotics and subcutaneous heparin. The abdomen was widely sterilely prepped with Chloraprep and draped. Patient timeout was performed and correct patient and procedure confirmed. Access was obtained with a 12 mm Optiview trocar in the left upper quadrant and pneumoperitoneum established without difficulty. Under direct vision 12 mm trocars were placed laterally in the right upper quadrant, right upper quadrant midclavicular line, and to the left and above the umbilicus for the camera port. A 5 mm trocar was placed laterally in the left upper quadrant.   Exparel/marcaine mix was infiltrated in bilateral lateral abdominal walls as a TAP block for postoperative pain relief.  The omentum was brought into the upper abdomen and the transverse mesocolon elevated and the ligament of Treitz clearly identified.  The patient had somewhat of natural adhesions between the transverse colon mesentery and the veil of the small bowel mesentery.  His ligament of Treitz was in his left upper quadrant a little more lateral than typical.  We ran the small bowel to confirm that we had the correct anatomy.  We identified the IMV.  A 50 cm biliopancreatic limb was then carefully measured from the ligament of Treitz. The small intestine was divided at this point with a single firing of the white load linear stapler. A Penrose drain was sutured to the end of the Roux-en-Y limb for later identification. A 100 cm Roux-en-Y limb was then carefully measured. At this point a side-to-side anastomosis was created between the Roux limb and the end of the biliopancreatic limb. This was accomplished with a single firing of the 60 mm white load linear stapler. The common enterotomy was closed with a running 2-0 Vicryl begun at either end of the enterotomy and tied centrally.  Vistaseal tissue sealant was placed over the anastomosis. The mesenteric defect was then closed with running 2-0 silk. The omentum was then divided with the harmonic scalpel up towards the transverse colon to allow mobility of the Roux limb toward the gastric pouch. The patient was then placed in steep reversed Trendelenburg. Through a 5 mm subxiphoid site the [facility] retractor was placed and the left lobe of the liver elevated with excellent exposure of the upper stomach and hiatus. The angle of Hiss was then mobilized with the harmonic scalpel. A 5 cm gastric pouch was then carefully measured along the lesser curve of the stomach.  The  patient had a plethora of vessels around the proximal stomach but nothing consistent with  portal hypertension.  Caudate lobe was not hypertrophied.  Dissection was carried along the lesser curve at this point with the Harmonic scalpel working carefully back toward the lesser sac at right angles to the lesser curve. The free lesser sac was then entered. After being sure all tubes were removed from the stomach an initial firing of the gold load 60 mm linear stapler was fired at right angles across the lesser curve for about 4 cm. The gastric pouch was further mobilized posteriorly and then the pouch was completed with 4 further firings of the 60 mm blue load linear stapler up through the previously dissected angle of His. It was ensured that the pouch was completely mobilized away from the gastric remnant. This created a nice tubular 5 cm gastric pouch. The Roux limb was then brought up in an antecolic fashion with the candycane facing to the patient's left without undue tension. The gastrojejunostomy was created with an initial posterior row of 2-0 Vicryl between the Roux limb and the staple line of the gastric pouch. Enterotomies were then made in the gastric pouch and the Roux limb with the harmonic scalpel and at approximately 2-2-1/2 cm anastomosis was created with a single firing of the 36mm blue load linear stapler. The staple line was inspected and was intact without bleeding. The common enterotomy was then closed with running 2-0 Vicryl begun at either end and tied centrally. The Ewall tube was then easily passed through the anastomosis and an outer anterior layer of running 2-0 Vicryl was placed. The Ewald tube was removed. With the outlet of the gastrojejunostomy clamped and under saline irrigation the assistant performed upper endoscopy and with the gastric pouch tensely distended with air-there was no evidence of leak on this test. The pouch was desufflated. The Terance Hart defect was closed with running 2-0 silk. The abdomen was inspected for any evidence of bleeding or bowel injury and  everything looked fine. The Nathanson retractor was removed under direct vision after coating the anastomosis with vistaseal tissue sealant. All CO2 was evacuated and trochars removed. Skin incisions were closed with 4-0 monocryl in a subcuticular fashion followed by  steri-strips and bandages. Sponge needle and instrument counts were correct. The patient was taken to the PACU in good condition.    Shawn Becker. Redmond Pulling, MD, FACS General, Bariatric, & Minimally Invasive Surgery [facility], [place]

## 2022-06-09 NOTE — Anesthesia Preprocedure Evaluation (Addendum)
Anesthesia Evaluation  Patient identified by MRN, date of birth, ID band Patient awake    Reviewed: Allergy & Precautions, NPO status , Patient's Chart, lab work & pertinent test results  History of Anesthesia Complications Negative for: history of anesthetic complications  Airway Mallampati: II  TM Distance: >3 FB Neck ROM: Full    Dental  (+) Missing, Dental Advisory Given   Pulmonary neg pulmonary ROS   breath sounds clear to auscultation       Cardiovascular hypertension, Pt. on medications (-) angina  Rhythm:Regular Rate:Normal  '19 ECHO: Left ventricle: The cavity size was normal. There was mild LVH. Systolic function was normal. EF 50-55%. Wall motion was normal; no regional wall motion  abnormalities. grade 1 diastolic dysfunction, normal RVF, trivial MR, trivial TR    Neuro/Psych negative neurological ROS     GI/Hepatic negative GI ROS, Neg liver ROS,,,  Endo/Other  diabetes (no longer on metformin, glu 114)Hypothyroidism  Morbid obesityBMI 39.7  Renal/GU negative Renal ROS     Musculoskeletal  (+) Arthritis ,    Abdominal  (+) + obese  Peds  Hematology negative hematology ROS (+)   Anesthesia Other Findings   Reproductive/Obstetrics                             Anesthesia Physical Anesthesia Plan  ASA: 3  Anesthesia Plan: General   Post-op Pain Management: Tylenol PO (pre-op)*   Induction: Intravenous  PONV Risk Score and Plan: 2 and Ondansetron and Dexamethasone  Airway Management Planned: Oral ETT  Additional Equipment: None  Intra-op Plan:   Post-operative Plan: Extubation in OR  Informed Consent: I have reviewed the patients History and Physical, chart, labs and discussed the procedure including the risks, benefits and alternatives for the proposed anesthesia with the patient or authorized representative who has indicated his/her understanding and acceptance.      Dental advisory given  Plan Discussed with: CRNA and Surgeon  Anesthesia Plan Comments:         Anesthesia Quick Evaluation

## 2022-06-09 NOTE — H&P (Signed)
PROVIDER: Devere Brem Leanne Chang, MD  MRN: [id number] DOB: 1971/03/11 DATE OF ENCOUNTER: 05/28/2022 Subjective  Chief Complaint: Follow-up   History of Present Illness: Shawn Becker is a 52 y.o. male who is seen today for long-term follow-up regarding his obesity and related comorbidities.  He has comorbidities include hypertension, diabetes mellitus type 2, osteoarthritis of multiple joints, Hashimoto's thyroiditis, fatty liver  I initially met him in November of last year to discuss bariatric surgery. He has completed the bariatric surgery evaluation process. He has received nutritional and psychological evaluation and clearance.Marland Kitchen  He denies any medical changes since I initially saw him. He did take some allergy medicine for seasonal allergies. No chest pain, no chest pressure, shortness of breath, dyspnea on exertion. No abdominal pain. No dysphagia. Normal bowel movements. Still has ongoing arthritis issues if he is doing a lot of walking. He has started school. He has an upcoming trip for that school.  Review of Systems: A complete review of systems was obtained from the patient. I have reviewed this information and discussed as appropriate with the patient. See HPI as well for other ROS.  ROS  Medical History: Past Medical History: Diagnosis Date Arthritis Diabetes mellitus without complication (CMS-HCC) Hypertension Thyroid disease  Patient Active Problem List Diagnosis Essential hypertension Hashimoto's thyroiditis  History reviewed. No pertinent surgical history.  Allergies Allergen Reactions Penicillins Hives and Other (See Comments)  Current Outpatient Medications on File Prior to Visit Medication Sig Dispense Refill cholecalciferol, vitamin D3, (VITAMIN D3) 125 mcg (5,000 unit) tablet Take by mouth levocetirizine (XYZAL) 5 MG tablet Take 5 mg by mouth every evening levothyroxine (SYNTHROID) 50 MCG tablet Take by mouth losartan (COZAAR) 100 MG tablet  Take 100 mg by mouth once daily  No current facility-administered medications on file prior to visit.  Family History Problem Relation Age of Onset Obesity Mother Diabetes Mother High blood pressure (Hypertension) Father   Social History  Tobacco Use Smoking Status Never Smokeless Tobacco Never   Social History  Socioeconomic History Marital status: Married Tobacco Use Smoking status: Never Smokeless tobacco: Never Substance and Sexual Activity Alcohol use: Never Drug use: Never  Objective:  Vitals: 05/28/22 0845 BP: (!) 128/90 Pulse: (!) 112 Temp: 36.6 C (97.8 F) SpO2: 98% Weight: (!) 135.6 kg (299 lb) Height: 182.9 cm (6') PainSc: 0-No pain  Body mass index is 40.55 kg/m.  Gen: alert, NAD, non-toxic appearing; severe obesity Pupils: equal, no scleral icterus Pulm: Lungs clear to auscultation, symmetric chest rise CV: regular rate and rhythm Abd: soft, nontender, nondistended. No cellulitis. No incisional hernia Ext: no edema, Skin: no rash, no jaundice  Labs, Imaging and Diagnostic Testing:  02/12/22 cxr - wnl; upper gi - wnl  Labs from January 23, 2022 revealed a normal B12 normal CBC, normal comprehensive metabolic panel. Hemoglobin A1c was 6.3, H. pylori negative Normal coags Assessment and Plan: Diagnoses and all orders for this visit:  Essential hypertension  Hashimoto's thyroiditis  Diabetes mellitus type 2 in obese  Primary osteoarthritis involving multiple joints  Fatty liver  Severe obesity (CMS-HCC)  Low HDL (under 40)    We reviewed his workup. We reviewed his labs, imaging studies. He has attended his preoperative education class. We rediscussed the typical hospitalization. We discussed the typical discharge criteria. We discussed the transition from liquid proteins to solid proteins after surgery. He reviewed the surgical consent form and signed it. I rediscussed the steps of the procedure. I believe the patient still a  safe candidate for Roux-en-Y gastric bypass.  We discussed that he could take Tylenol preoperatively as well as postoperatively. Also told him I was fine with him taking some NSAIDs preoperatively if he needed to but reminded him that he would need to avoid NSAIDs postoperatively. He states he generally does not take NSAIDs.  This patient encounter took 25 minutes today to perform the following: take history, perform exam, review outside records, interpret imaging, counsel the patient on their diagnosis and document encounter, findings & plan in the EHR  No follow-ups on file.  Leighton Ruff. Redmond Pulling MD FACS General, Minimally Invasive, & Bariatric Surgery Electronically signed by Rudean Curt, MD at 05/28/2022 9:07 AM EST

## 2022-06-09 NOTE — Discharge Instructions (Signed)

## 2022-06-09 NOTE — Op Note (Signed)
Preoperative diagnosis: Roux-en-Y gastric bypass  Postoperative diagnosis: Same   Procedure: Upper endoscopy   Surgeon: Clovis Riley, M.D.  Anesthesia: Gen.   Description of procedure: The endoscope was placed in the mouth and oropharynx and under endoscopic vision it was advanced to the esophagogastric junction which was identified at 42cm from the teeth.  The pouch was tensely insufflated while the upper abdomen was flooded with irrigation to perform a leak test, which was negative. No bubbles were seen.  The staple line was hemostatic and the anastomosis is visibly patent. The pouch measures 5cm in length. The lumen was decompressed and the scope was withdrawn without difficulty.    Clovis Riley, M.D. General, Bariatric, & Minimally Invasive Surgery [facility]

## 2022-06-09 NOTE — Interval H&P Note (Signed)
History and Physical Interval Note:  06/09/2022 10:20 AM  Shawn Becker  has presented today for surgery, with the diagnosis of MORBID OBESITY.  The various methods of treatment have been discussed with the patient and family. After consideration of risks, benefits and other options for treatment, the patient has consented to  Procedure(s): LAPAROSCOPIC ROUX-EN-Y GASTRIC BYPASS WITH UPPER ENDOSCOPY (N/A) as a surgical intervention.  The patient's history has been reviewed, patient examined, no change in status, stable for surgery.  I have reviewed the patient's chart and labs.  Questions were answered to the patient's satisfaction.     Greer Pickerel

## 2022-06-10 ENCOUNTER — Encounter (HOSPITAL_COMMUNITY): Payer: Self-pay | Admitting: General Surgery

## 2022-06-10 DIAGNOSIS — E063 Autoimmune thyroiditis: Secondary | ICD-10-CM | POA: Diagnosis not present

## 2022-06-10 LAB — GLUCOSE, CAPILLARY
Glucose-Capillary: 109 mg/dL — ABNORMAL HIGH (ref 70–99)
Glucose-Capillary: 112 mg/dL — ABNORMAL HIGH (ref 70–99)
Glucose-Capillary: 114 mg/dL — ABNORMAL HIGH (ref 70–99)
Glucose-Capillary: 144 mg/dL — ABNORMAL HIGH (ref 70–99)
Glucose-Capillary: 79 mg/dL (ref 70–99)
Glucose-Capillary: 81 mg/dL (ref 70–99)
Glucose-Capillary: 97 mg/dL (ref 70–99)

## 2022-06-10 LAB — COMPREHENSIVE METABOLIC PANEL
ALT: 58 U/L — ABNORMAL HIGH (ref 0–44)
AST: 47 U/L — ABNORMAL HIGH (ref 15–41)
Albumin: 3.8 g/dL (ref 3.5–5.0)
Alkaline Phosphatase: 56 U/L (ref 38–126)
Anion gap: 10 (ref 5–15)
BUN: 13 mg/dL (ref 6–20)
CO2: 23 mmol/L (ref 22–32)
Calcium: 8.3 mg/dL — ABNORMAL LOW (ref 8.9–10.3)
Chloride: 101 mmol/L (ref 98–111)
Creatinine, Ser: 1.28 mg/dL — ABNORMAL HIGH (ref 0.61–1.24)
GFR, Estimated: >60 mL/min (ref >60)
Glucose, Bld: 126 mg/dL — ABNORMAL HIGH (ref 70–99)
Potassium: 4.4 mmol/L (ref 3.5–5.1)
Sodium: 134 mmol/L — ABNORMAL LOW (ref 135–145)
Total Bilirubin: 0.7 mg/dL (ref 0.3–1.2)
Total Protein: 7.1 g/dL (ref 6.5–8.1)

## 2022-06-10 LAB — CBC WITH DIFFERENTIAL/PLATELET
Abs Immature Granulocytes: 0.04 10*3/uL (ref 0.00–0.07)
Basophils Absolute: 0 10*3/uL (ref 0.0–0.1)
Basophils Relative: 0 %
Eosinophils Absolute: 0 10*3/uL (ref 0.0–0.5)
Eosinophils Relative: 0 %
HCT: 38.6 % — ABNORMAL LOW (ref 39.0–52.0)
Hemoglobin: 12.5 g/dL — ABNORMAL LOW (ref 13.0–17.0)
Immature Granulocytes: 0 %
Lymphocytes Relative: 9 %
Lymphs Abs: 0.8 10*3/uL (ref 0.7–4.0)
MCH: 26.7 pg (ref 26.0–34.0)
MCHC: 32.4 g/dL (ref 30.0–36.0)
MCV: 82.3 fL (ref 80.0–100.0)
Monocytes Absolute: 0.8 10*3/uL (ref 0.1–1.0)
Monocytes Relative: 9 %
Neutro Abs: 7.3 10*3/uL (ref 1.7–7.7)
Neutrophils Relative %: 82 %
Platelets: 236 10*3/uL (ref 150–400)
RBC: 4.69 MIL/uL (ref 4.22–5.81)
RDW: 14.6 % (ref 11.5–15.5)
WBC: 9 10*3/uL (ref 4.0–10.5)
nRBC: 0 % (ref 0.0–0.2)

## 2022-06-10 MED ORDER — SUCRALFATE 1 GM/10ML PO SUSP
1.0000 g | Freq: Three times a day (TID) | ORAL | Status: DC
  Administered 2022-06-10 (×2): 1 g via ORAL
  Filled 2022-06-10 (×3): qty 10

## 2022-06-10 MED ORDER — EPHEDRINE 5 MG/ML INJ
INTRAVENOUS | Status: AC
  Filled 2022-06-10: qty 5

## 2022-06-10 NOTE — Progress Notes (Signed)
1 Day Post-Op   Subjective/Chief Complaint: Not really any nausea Feel discomfort with water as well as with his initial protein intake Not severe pain but can definitely feel it when he is drinking liquids Stomach feels a little bit bloated   Objective: Vital signs in last 24 hours: Temp:  [97.6 F (36.4 C)-99.4 F (37.4 C)] 98 F (36.7 C) (03/19 1354) Pulse Rate:  [58-80] 58 (03/19 1354) Resp:  [16-18] 16 (03/19 1354) BP: (114-134)/(61-73) 121/61 (03/19 1354) SpO2:  [98 %-100 %] 100 % (03/19 1354) Last BM Date : 06/09/22  Intake/Output from previous day: 03/18 0701 - 03/19 0700 In: 3659.8 [P.O.:360; I.V.:3199.8; IV Piggyback:100] Out: 1475 [Urine:1450; Blood:25] Intake/Output this shift: Total I/O In: 120 [P.O.:120] Out: 1600 [Urine:1600]  Alert, nontoxic, ambulating in the hall Nonlabored Not tachycardic Soft, perhaps a little bit bloated, incisions okay, mild appropriate tenderness  Lab Results:  Recent Labs    06/09/22 1539 06/09/22 1704 06/10/22 0454  WBC 11.3*  --  9.0  HGB 13.3 13.4 12.5*  HCT 40.8 41.7 38.6*  PLT 274  --  236   BMET Recent Labs    06/10/22 0454  NA 134*  K 4.4  CL 101  CO2 23  GLUCOSE 126*  BUN 13  CREATININE 1.28*  CALCIUM 8.3*   PT/INR No results for input(s): "LABPROT", "INR" in the last 72 hours. ABG No results for input(s): "PHART", "HCO3" in the last 72 hours.  Invalid input(s): "PCO2", "PO2"  Studies/Results: No results found.  Anti-infectives: Anti-infectives (From admission, onward)    Start     Dose/Rate Route Frequency Ordered Stop   06/09/22 0930  cefoTEtan (CEFOTAN) 2 g in sodium chloride 0.9 % 100 mL IVPB        2 g 200 mL/hr over 30 Minutes Intravenous On call to O.R. 06/09/22 0921 06/09/22 1050       Assessment/Plan: s/p Procedure(s): LAPAROSCOPIC ROUX-EN-Y GASTRIC BYPASS WITH UPPER ENDOSCOPY (N/A)  Not appropriate for discharge today due to inadequate oral intake and at high risk for  readmission Continue bariatric clears/fulls as tolerated Ambulate Continue chemical VTE prophylaxis We will add Carafate and see if that helps with discomfort with oral intake  LOS: 1 day    Greer Pickerel 06/10/2022

## 2022-06-10 NOTE — Progress Notes (Signed)
Patient alert and oriented, pain is controlled. Patient is tolerating fluids, advanced to protein shake today, patient is tolerating minimally. Pt still feeling like he has a full feeling in chest and occasional burning sensation. Communicated update w/ Psychologist, sport and exercise. will continue to monitor.

## 2022-06-10 NOTE — Progress Notes (Signed)
Patient alert and oriented, pain is controlled. Patient is tolerating fluids, advanced to protein shake today, patient is tolerating well. Reviewed Gastric sleeve/bypass discharge instructions with patient and patient is able to articulate understanding. Provided information on BELT program, Support Group and [facility] outpatient pharmacy. Communicated general update of patient status to surgeon. All questions answered. 24hr fluid recall is 360 mL per hydration protocol, bariatric nurse coordinator to make follow-up phone call within one week.

## 2022-06-10 NOTE — Anesthesia Postprocedure Evaluation (Signed)
Anesthesia Post Note  Patient: Shawn Becker  Procedure(s) Performed: LAPAROSCOPIC ROUX-EN-Y GASTRIC BYPASS WITH UPPER ENDOSCOPY     Patient location during evaluation: PACU Anesthesia Type: General Level of consciousness: awake and alert Pain management: pain level controlled Vital Signs Assessment: post-procedure vital signs reviewed and stable Respiratory status: spontaneous breathing, nonlabored ventilation, respiratory function stable and patient connected to nasal cannula oxygen Cardiovascular status: blood pressure returned to baseline and stable Postop Assessment: no apparent nausea or vomiting Anesthetic complications: no   No notable events documented.  Last Vitals:  Vitals:   06/10/22 1354 06/10/22 2011  BP: 121/61 136/76  Pulse: (!) 58 79  Resp: 16 18  Temp: 36.7 C 37.5 C  SpO2: 100% 100%    Last Pain:  Vitals:   06/10/22 2036  TempSrc:   PainSc: 6                  Tiajuana Amass

## 2022-06-10 NOTE — TOC CM/SW Note (Signed)
Transition of Care [facility]) Screening Note  Patient Details  Name: Shawn Becker Date of Birth: 1971-01-07  Transition of Care [facility]) CM/SW Contact:    Sherie Don, LCSW Phone Number: 06/10/2022, 8:43 AM  Transition of Care Department [facility]) has reviewed patient and no TOC needs have been identified at this time. We will continue to monitor patient advancement through interdisciplinary progression rounds. If new patient transition needs arise, please place a TOC consult.

## 2022-06-11 DIAGNOSIS — E063 Autoimmune thyroiditis: Secondary | ICD-10-CM | POA: Diagnosis not present

## 2022-06-11 LAB — BASIC METABOLIC PANEL
Anion gap: 11 (ref 5–15)
BUN: 16 mg/dL (ref 6–20)
CO2: 24 mmol/L (ref 22–32)
Calcium: 8.6 mg/dL — ABNORMAL LOW (ref 8.9–10.3)
Chloride: 102 mmol/L (ref 98–111)
Creatinine, Ser: 1.22 mg/dL (ref 0.61–1.24)
GFR, Estimated: >60 mL/min (ref >60)
Glucose, Bld: 106 mg/dL — ABNORMAL HIGH (ref 70–99)
Potassium: 4 mmol/L (ref 3.5–5.1)
Sodium: 137 mmol/L (ref 135–145)

## 2022-06-11 LAB — GLUCOSE, CAPILLARY
Glucose-Capillary: 102 mg/dL — ABNORMAL HIGH (ref 70–99)
Glucose-Capillary: 106 mg/dL — ABNORMAL HIGH (ref 70–99)
Glucose-Capillary: 96 mg/dL (ref 70–99)

## 2022-06-11 LAB — CBC WITH DIFFERENTIAL/PLATELET
Abs Immature Granulocytes: 0.02 10*3/uL (ref 0.00–0.07)
Basophils Absolute: 0 10*3/uL (ref 0.0–0.1)
Basophils Relative: 0 %
Eosinophils Absolute: 0 10*3/uL (ref 0.0–0.5)
Eosinophils Relative: 0 %
HCT: 39.3 % (ref 39.0–52.0)
Hemoglobin: 13 g/dL (ref 13.0–17.0)
Immature Granulocytes: 0 %
Lymphocytes Relative: 25 %
Lymphs Abs: 1.6 10*3/uL (ref 0.7–4.0)
MCH: 27.1 pg (ref 26.0–34.0)
MCHC: 33.1 g/dL (ref 30.0–36.0)
MCV: 81.9 fL (ref 80.0–100.0)
Monocytes Absolute: 0.7 10*3/uL (ref 0.1–1.0)
Monocytes Relative: 11 %
Neutro Abs: 4.1 10*3/uL (ref 1.7–7.7)
Neutrophils Relative %: 64 %
Platelets: 224 10*3/uL (ref 150–400)
RBC: 4.8 MIL/uL (ref 4.22–5.81)
RDW: 14.5 % (ref 11.5–15.5)
WBC: 6.4 10*3/uL (ref 4.0–10.5)
nRBC: 0 % (ref 0.0–0.2)

## 2022-06-11 MED ORDER — PANTOPRAZOLE SODIUM 40 MG PO TBEC: 40.0000 mg | DELAYED_RELEASE_TABLET | Freq: Every day | ORAL | 0 refills | Status: DC

## 2022-06-11 MED ORDER — ONDANSETRON 4 MG PO TBDP: 4.0000 mg | ORAL_TABLET | Freq: Four times a day (QID) | ORAL | 0 refills | Status: DC | PRN

## 2022-06-11 MED ORDER — ACETAMINOPHEN 500 MG PO TABS: 1000.0000 mg | ORAL_TABLET | Freq: Three times a day (TID) | ORAL | 0 refills | Status: AC

## 2022-06-11 MED ORDER — TRAMADOL HCL 50 MG PO TABS: 50.0000 mg | ORAL_TABLET | Freq: Four times a day (QID) | ORAL | 0 refills | Status: DC | PRN

## 2022-06-11 NOTE — Plan of Care (Signed)
Patient is stable for discharge. Discharge instructions have been given. All questions were answered , patient is discharged to home with family.

## 2022-06-12 ENCOUNTER — Telehealth: Payer: Self-pay

## 2022-06-12 NOTE — Discharge Summary (Signed)
Physician Discharge Summary  Shawn Becker [id number] DOB: 07-08-1970 DOA: 06/09/2022  PCP: Dorothyann Peng, NP  Admit date: 06/09/2022 Discharge date: 06/11/2022  Recommendations for Outpatient Follow-up:     Follow-up Information     Greer Pickerel, MD Follow up on 07/02/2022.   Specialty: General Surgery Why: Please arrive 15 minutes prior to your appointment at 9:30am Contact information: [address] [place] [address] [phone]         Greer Pickerel, MD Follow up on 08/05/2022.   Specialty: General Surgery Why: Please arrive 15 minutes prior to your appointment at 2:30pm Contact information: [place] [address] [phone]         [facility]. Go on 06/12/2022.   Why: [facility] is on every 3rd Thur. @ 6p please plan to join online or in-person Contact information: [employer]               Discharge Diagnoses:  Principal Problem:   S/P gastric bypass Essential hypertension  Hashimoto's thyroiditis  Diabetes mellitus type 2 in obese  Primary osteoarthritis involving multiple joints  Fatty liver  Severe obesity (BMI 39.7)  Low HDL (under 40)   Surgical Procedure: Laparoscopic Roux-en-Y gastric bypass, upper endoscopy  Discharge Condition: Good Disposition: Home  Diet recommendation: Postoperative gastric bypass diet  Filed Weights   06/09/22 0935  Weight: 132.9 kg     Hospital Course:  The patient was admitted for a planned laparoscopic Roux-en-Y gastric bypass. Please see operative note. Preoperatively the patient was given 5000 units of subcutaneous heparin for DVT prophylaxis. ERAS protocol was used. Postoperative prophylactic heparin dosing was started on the evening of postoperative day 0.  The patient was started on ice chips and water on the evening of POD 0 which they tolerated. On postoperative day 1 The patient's diet was advanced to  protein shakes which they also tolerated but he didn't meet criteria for dc due to poor oral intake. Was having some discomfort with oral intake. On POD 2, The patient was ambulating without difficulty. Their vital signs are stable without fever or tachycardia. His oral intake had improved. Their hemoglobin had remained stable. The patient had received discharge instructions and counseling. They were deemed stable for discharge.  BP (!) 153/89 (BP Location: Left Arm) Comment: notify to nurse  Pulse 82   Temp 98.6 F (37 C) (Oral)   Resp 14   Ht 6' (1.829 m)   Wt 132.9 kg   SpO2 100%   BMI 39.74 kg/m   Gen: alert, NAD, non-toxic appearing Pupils: equal, no scleral icterus Pulm: Lungs clear to auscultation, symmetric chest rise CV: regular rate and rhythm Abd: soft, min tender, nondistended. No cellulitis. No incisional hernia Ext: no edema, no calf tenderness Skin: no rash, no jaundice  Discharge Instructions  Discharge Instructions     Ambulate hourly while awake   Complete by: As directed    Call MD for:  difficulty breathing, headache or visual disturbances   Complete by: As directed    Call MD for:  persistant dizziness or light-headedness   Complete by: As directed    Call MD for:  persistant nausea and vomiting   Complete by: As directed    Call MD for:  redness, tenderness, or signs of infection (pain, swelling, redness, odor or green/yellow discharge around incision site)   Complete by: As directed    Call MD for:  severe uncontrolled pain  Complete by: As directed    Call MD for:  temperature >101 F   Complete by: As directed    Diet bariatric full liquid   Complete by: As directed    Discharge instructions   Complete by: As directed    See bariatric discharge instructions   Incentive spirometry   Complete by: As directed    Perform hourly while awake      Allergies as of 06/11/2022       Reactions   Metformin And Related    Joint Pain    Penicillins  Hives   Shellfish Allergy Hives        Medication List     TAKE these medications    acetaminophen 500 MG tablet Commonly known as: TYLENOL Take 2 tablets (1,000 mg total) by mouth every 8 (eight) hours for 5 days.   fluticasone 50 MCG/ACT nasal spray Commonly known as: FLONASE Place 1 spray into both nostrils daily as needed for allergies or rhinitis.   levothyroxine 50 MCG tablet Commonly known as: SYNTHROID Take 1 tablet (50 mcg total) by mouth daily before breakfast. Notes to patient: Medication may not be effective for the next 30 days.  Use precaution if necessary.     losartan 100 MG tablet Commonly known as: COZAAR Take 1 tablet (100 mg total) by mouth daily. Notes to patient: Monitor Blood Pressure Daily and keep a log for primary care physician.  Your physician may need to make changes to your medications with rapid weight loss.      ondansetron 4 MG disintegrating tablet Commonly known as: ZOFRAN-ODT Take 1 tablet (4 mg total) by mouth every 6 (six) hours as needed for nausea or vomiting.   pantoprazole 40 MG tablet Commonly known as: PROTONIX Take 1 tablet (40 mg total) by mouth daily.   traMADol 50 MG tablet Commonly known as: ULTRAM Take 1 tablet (50 mg total) by mouth every 6 (six) hours as needed (pain).        Follow-up Information     Greer Pickerel, MD Follow up on 07/02/2022.   Specialty: General Surgery Why: Please arrive 15 minutes prior to your appointment at 9:30am Contact information: [address] [place] [address] [phone]         Greer Pickerel, MD Follow up on 08/05/2022.   Specialty: General Surgery Why: Please arrive 15 minutes prior to your appointment at 2:30pm Contact information: [place] [address] [phone]         [facility]. Go on 06/12/2022.   Why: [facility] is on every 3rd Thur. @ 6p please plan to join online or  in-person Contact information: [employer]                 The results of significant diagnostics from this hospitalization (including imaging, microbiology, ancillary and laboratory) are listed below for reference.    Significant Diagnostic Studies: No results found.  Labs: Basic Metabolic Panel: Recent Labs  Lab 06/10/22 0454 06/11/22 0445  NA 134* 137  K 4.4 4.0  CL 101 102  CO2 23 24  GLUCOSE 126* 106*  BUN 13 16  CREATININE 1.28* 1.22  CALCIUM 8.3* 8.6*   Liver Function Tests: Recent Labs  Lab 06/10/22 0454  AST 47*  ALT 58*  ALKPHOS 56  BILITOT 0.7  PROT 7.1  ALBUMIN 3.8    CBC: Recent Labs  Lab 06/09/22 1539 06/09/22 1704 06/10/22 0454  06/11/22 0445  WBC 11.3*  --  9.0 6.4  NEUTROABS  --   --  7.3 4.1  HGB 13.3 13.4 12.5* 13.0  HCT 40.8 41.7 38.6* 39.3  MCV 82.1  --  82.3 81.9  PLT 274  --  236 224    CBG: Recent Labs  Lab 06/10/22 2008 06/10/22 2353 06/11/22 0354 06/11/22 0739 06/11/22 1116  GLUCAP 97 114* 102* 106* 96    Principal Problem:   S/P gastric bypass   Time coordinating discharge: 20 min  Signed:  Gayland Curry, MD [place] [phone] 06/12/2022, 10:09 AM

## 2022-06-12 NOTE — Transitions of Care (Post Inpatient/ED Visit) (Signed)
   06/12/2022  Name: Shawn Becker MRN: [id number] DOB: 06-Oct-1970  Today's TOC FU Call Status: Today's TOC FU Call Status:: Successful TOC FU Call Competed TOC FU Call Complete Date: 06/12/22  Transition Care Management Follow-up Telephone Call Date of Discharge: 06/11/22 Discharge Facility: [facility]) Type of Discharge: Inpatient Admission Primary Inpatient Discharge Diagnosis:: S/P gastric bypass How have you been since you were released from the hospital?: Better Any questions or concerns?: No  Items Reviewed: Did you receive and understand the discharge instructions provided?: Yes Medications obtained and verified?: Yes (Medications Reviewed) Any new allergies since your discharge?: Yes Dietary orders reviewed?: Yes Do you have support at home?: Yes  Home Care and Equipment/Supplies: [place] Ordered?: No Any new equipment or medical supplies ordered?: No  Functional Questionnaire: Do you need assistance with bathing/showering or dressing?: No Do you need assistance with meal preparation?: No Do you need assistance with eating?: No Do you have difficulty maintaining continence: No Do you need assistance with getting out of bed/getting out of a chair/moving?: No Do you have difficulty managing or taking your medications?: No  Follow up appointments reviewed: PCP Follow-up appointment confirmed?: No (specialist) MD Provider Line Number:[phone] Given: Yes [facility] Follow-up appointment confirmed?: Yes Date of Specialist follow-up appointment?: 07/02/22 Follow-Up Specialty Provider:: Dr Redmond Pulling Do you need transportation to your follow-up appointment?: No Do you understand care options if your condition(s) worsen?: Yes-patient verbalized understanding    [place] LPN [place] Direct Dial [phone]

## 2022-06-16 ENCOUNTER — Telehealth (HOSPITAL_COMMUNITY): Payer: Self-pay | Admitting: *Deleted

## 2022-06-16 NOTE — Telephone Encounter (Signed)
Attempted to contact pt for follow-up phone call. Voicemail left.

## 2022-06-16 NOTE — Telephone Encounter (Signed)
1. Tell me about your pain and pain management?    Pt denies any pain at the moment  2. Let's talk about fluid intake. How much total fluid are you taking in?   Pt states that s/he is getting in at least 40oz oz of fluid including protein shakes, bottled water plus and additional 8oz to 16oz of tea. For a total of 56oz on average per day Pt encouraged to continue to work towards meeting goal.       3. How much protein have you taken in the last day?    Pt states that he is working to meet the goal of 80g of protein today, he is currently able to meet 60g of protein. Pt plans to drink the reminder of goal throughout the day to meet criteria.     4. Have you had nausea? Tell me about when you have experienced nausea and what you did to help?   Pt denies nausea.   5. Tell me what your incisions look like?   "Incisions look fine". Pt denies a fever, chills. Pt states incisions are not swollen, open, or draining. Pt encouraged to call [facility] if incisions change.    7. Have you been passing gas? BM?   Pt states that they are having BMs. First BM since Friday last week  Pt instructed to take either Miralax or MoM as instructed per "Gastric Bypass/Sleeve Discharge Home Care Instructions". Pt to call surgeon's office if not able to have BM with medication.     8. If a problem or question were to arise who would you call? Do you know contact numbers for [facility], [facility], and [facility]?   Pt knows to call [facility] for surgical, [facility] for nutrition, and [place] for non-urgent questions or concerns. Pt denies dehydration symptoms. Pt can describe s/sx of dehydration.   9. How has the walking going?   Pt states s/he is walking around and able to be active without difficulty.   10. Are you still using your incentive spirometer? If so, how often?   Pt states that s/he is doing the I.S. Pt encouraged to use incentive spirometer, at least 10x every hour while awake until s/he sees the surgeon.   11. How are your  vitamins and calcium going? How are you taking them?    Pt states that s/he is taking his/her supplements and vitamins without difficulty.

## 2022-06-24 ENCOUNTER — Encounter: Payer: Self-pay | Admitting: Dietician

## 2022-06-24 ENCOUNTER — Encounter: Payer: 59 | Attending: General Surgery | Admitting: Dietician

## 2022-06-24 VITALS — Ht 72.0 in | Wt 273.0 lb

## 2022-06-24 DIAGNOSIS — E119 Type 2 diabetes mellitus without complications: Secondary | ICD-10-CM | POA: Diagnosis present

## 2022-06-24 DIAGNOSIS — E669 Obesity, unspecified: Secondary | ICD-10-CM | POA: Insufficient documentation

## 2022-06-24 NOTE — Progress Notes (Signed)
2 Week Post-Operative Nutrition Class   Patient was seen on 06/24/2022 for Post-Operative Nutrition education at the [facility].    Surgery date: 06/09/2022 Surgery type: RYGB  Anthropometrics  Start weight at [facility]: 293.4 lbs (date: 02/10/2022)  Height: 72 in Weight today: 273.0 BMI: 37.03 kg/m2     Clinical  Medical hx: hypertension, diabetes mellitus type 2, osteoarthritis of multiple joints, Hashimoto's thyroiditis  Medications: Losartan HCT, levothyroxine, vit D  Labs: A1C 6.3; TSH 5.180 Notable signs/symptoms: none noted Any previous deficiencies? No Bowel Habits: Every day to every other day no complaints   Body Composition Scale 06/24/2022  Current Body Weight 273.0  Total Body Fat % 32.4  Visceral Fat 24  Fat-Free Mass % 67.5   Total Body Water % 48.5  Muscle-Mass lbs 51.4  BMI 37.1  Body Fat Displacement          Torso  lbs 54.9         Left Leg  lbs 10.9         Right Leg  lbs 10.9         Left Arm  lbs 5.4         Right Arm  lbs 5.4      The following the learning objectives were met by the patient during this course: Identifies Soft Prepped Plan Advancement Guide  Identifies Soft, High Proteins (Phase 1), beginning 2 weeks post-operatively to 3 weeks post-operatively Identifies Additional Soft High Proteins, soft non-starchy vegetables, fruits and starches (Phase 2), beginning 3 weeks post-operatively to 3 months post-operatively Identifies appropriate sources of fluids, proteins, vegetables, fruits and starches Identifies appropriate fat sources and healthy verses unhealthy fat types   States protein, vegetable, fruit and starch recommendations and appropriate sources post-operatively Identifies the need for appropriate texture modifications, mastication, and bite sizes when consuming solids Identifies appropriate fat consumption and sources Identifies appropriate multivitamin and calcium sources post-operatively Describes  the need for physical activity post-operatively and will follow MD recommendations States when to call healthcare provider regarding medication questions or post-operative complications   Handouts given during class include: Soft Prepped Plan Advancement Guide   Follow-Up Plan: Patient will follow-up at [facility] in 10 weeks for 3 month post-op nutrition visit for diet advancement per MD.

## 2022-07-03 ENCOUNTER — Telehealth: Payer: Self-pay | Admitting: Dietician

## 2022-07-03 NOTE — Telephone Encounter (Signed)
RD called pt to verify fluid intake once starting soft, solid proteins 2 week post-bariatric surgery.   Daily Fluid intake: 48-64 oz Daily Protein intake: 80 grams Bowel Habits: daily to every other day; started yesterday to use Miralax to maintain daily.  Concerns/issues: no issues, feels like food is stuck at the bottom of throat, but with no issues.  Pt states he wants to check out the BELT program.

## 2022-07-17 ENCOUNTER — Ambulatory Visit (INDEPENDENT_AMBULATORY_CARE_PROVIDER_SITE_OTHER): Payer: 59 | Admitting: Adult Health

## 2022-07-17 VITALS — BP 130/88 | HR 90 | Temp 98.5°F | Ht 72.0 in | Wt 265.0 lb

## 2022-07-17 DIAGNOSIS — R1032 Left lower quadrant pain: Secondary | ICD-10-CM | POA: Diagnosis not present

## 2022-07-17 MED ORDER — NAPROXEN 500 MG PO TABS: 500.0000 mg | ORAL_TABLET | Freq: Two times a day (BID) | ORAL | 0 refills | Status: DC

## 2022-07-17 NOTE — Progress Notes (Signed)
Subjective:    Patient ID: Shawn Becker, male    DOB: 1971/01/02, 52 y.o.   MRN: [id number]  Leg Pain    52 year old male who  has a past medical history of Allergy, Arthritis, Fatty liver, Hypertension, Hypothyroidism, Left ventricular hypertrophy, and Pre-diabetes.  He has known bilateral hip arthritis diagnosed in  2021. For the last few years he has had a pretty constant mild pain in his left groin that has caused him to have a limp. Last week he was in the garage and turned wrong which caused him some significant pain that was " shooting pain".  Throughout the week he would have trouble raising his leg and had to go slow when trying to put on his shoes so that it would not aggravate his left groin.  Pain has improved over the last day or 2 and he is able to have more range of motion with his left leg.  He still has pretty significant pain if he makes a wrong turn but overall back to baseline.  Been using over-the-counter medications with some improvement.  Should be noted that about a month ago he went through bariatric surgery and is doing well has not had any postop complications and is advancing his diet as tolerated.   Review of Systems See HPI   Past Medical History:  Diagnosis Date   Allergy    pollen   Arthritis    Fatty liver    Hypertension    Hypothyroidism    Left ventricular hypertrophy    Pre-diabetes     Social History   Socioeconomic History   Marital status: Married    Spouse name: Taneka   Number of children: Not on file   Years of education: Not on file   Highest education level: Bachelor's degree (e.g., BA, AB, BS)  Occupational History   Occupation: Pastor/Musician/Studeny  Tobacco Use   Smoking status: Never   Smokeless tobacco: Never  Vaping Use   Vaping Use: Never used  Substance and Sexual Activity   Alcohol use: No    Alcohol/week: 0.0 standard drinks of alcohol   Drug use: No   Sexual activity: Not on file  Other Topics  Concern   Not on file  Social History Narrative   Renato Gails    Married    4 children          He likes to ride his motorcycle.       Social Determinants of Health   Financial Resource Strain: Not on file  Food Insecurity: Not on file  Transportation Needs: No Transportation Needs (07/17/2022)   PRAPARE - Administrator, Civil Service (Medical): No    Lack of Transportation (Non-Medical): No  Physical Activity: Insufficiently Active (07/17/2022)   Exercise Vital Sign    Days of Exercise per Week: 2 days    Minutes of Exercise per Session: 30 min  Stress: Stress Concern Present (07/17/2022)   [employer] of Occupational Health - Occupational Stress Questionnaire    Feeling of Stress : To some extent  Social Connections: Socially Integrated (07/17/2022)   Social Connection and Isolation Panel [NHANES]    Frequency of Communication with Friends and Family: More than three times a week    Frequency of Social Gatherings with Friends and Family: Three times a week    Attends Religious Services: More than 4 times per year    Active Member of Clubs or Organizations: Yes    Attends Club  or Organization Meetings: More than 4 times per year    Marital Status: Married  Catering manager Violence: Not on file    Past Surgical History:  Procedure Laterality Date   COLONOSCOPY  2020   GASTRIC ROUX-EN-Y N/A 06/09/2022   Procedure: LAPAROSCOPIC ROUX-EN-Y GASTRIC BYPASS WITH UPPER ENDOSCOPY;  Surgeon: Gaynelle Adu, MD;  Location: [facility];  Service: General;  Laterality: N/A;   WISDOM TOOTH EXTRACTION  2018    Family History  Problem Relation Age of Onset   Diabetes Mother    Hypertension Mother    Mental illness Mother    Bipolar disorder Mother    Schizophrenia Mother    Obesity Mother    Diabetes Maternal Grandmother    Hypertension Maternal Grandmother    Cancer Maternal Grandfather        colon cancer   Colon cancer Maternal Grandfather    Colon polyps Maternal  Grandfather    Hyperlipidemia Paternal Grandmother    Cancer Paternal Grandfather        prostate cancer   Alcoholism Father    Drug abuse Father    Colon polyps Maternal Uncle    Esophageal cancer Neg Hx    Rectal cancer Neg Hx    Stomach cancer Neg Hx     Allergies  Allergen Reactions   Metformin And Related     Joint Pain    Penicillins Hives   Shellfish Allergy Hives    Current Outpatient Medications on File Prior to Visit  Medication Sig Dispense Refill   fluticasone (FLONASE) 50 MCG/ACT nasal spray Place 1 spray into both nostrils daily as needed for allergies or rhinitis.     levothyroxine (SYNTHROID) 50 MCG tablet Take 1 tablet (50 mcg total) by mouth daily before breakfast. 90 tablet 3   losartan (COZAAR) 100 MG tablet Take 1 tablet (100 mg total) by mouth daily. 90 tablet 3   ondansetron (ZOFRAN-ODT) 4 MG disintegrating tablet Take 1 tablet (4 mg total) by mouth every 6 (six) hours as needed for nausea or vomiting. 20 tablet 0   pantoprazole (PROTONIX) 40 MG tablet Take 1 tablet (40 mg total) by mouth daily. 90 tablet 0   traMADol (ULTRAM) 50 MG tablet Take 1 tablet (50 mg total) by mouth every 6 (six) hours as needed (pain). 10 tablet 0   No current facility-administered medications on file prior to visit.    BP 130/88   Pulse 90   Temp 98.5 F (36.9 C) (Oral)   Ht 6' (1.829 m)   Wt 265 lb (120.2 kg)   SpO2 96%   BMI 35.94 kg/m       Objective:   Physical Exam Vitals and nursing note reviewed.  Constitutional:      Appearance: Normal appearance.  Musculoskeletal:     Left hip: No bony tenderness or crepitus. Decreased range of motion. Normal strength.     Comments: Tenderness in left groin with straight leg raise, knee-to-chest and internal/external rotation.  Neurological:     General: No focal deficit present.     Mental Status: He is alert and oriented to person, place, and time.  Psychiatric:        Mood and Affect: Mood normal.        Behavior:  Behavior normal.        Thought Content: Thought content normal.        Judgment: Judgment normal.       Assessment & Plan:  1. Left groin pain -This may  be worsening arthritis but there is high suspicion for labral tear onset has been going on multiple years and worsened recently.  Will order MRI of left hip.  Consider referral to orthopedics - MR HIP LEFT WO CONTRAST; Future - naproxen (NAPROSYN) 500 MG tablet; Take 1 tablet (500 mg total) by mouth 2 (two) times daily with a meal.  Dispense: 30 tablet; Refill: 0  Shirline Frees, NP  Time spent with patient today was 31 minutes which consisted of chart review, discussing left groin pain/osteoarthritis,labral tear, work up, treatment answering questions and documentation.

## 2022-07-30 ENCOUNTER — Other Ambulatory Visit: Payer: Self-pay | Admitting: Adult Health

## 2022-07-30 DIAGNOSIS — R1032 Left lower quadrant pain: Secondary | ICD-10-CM

## 2022-08-01 ENCOUNTER — Ambulatory Visit (INDEPENDENT_AMBULATORY_CARE_PROVIDER_SITE_OTHER): Payer: 59

## 2022-08-01 ENCOUNTER — Other Ambulatory Visit: Payer: 59

## 2022-08-01 DIAGNOSIS — R1032 Left lower quadrant pain: Secondary | ICD-10-CM

## 2022-08-07 ENCOUNTER — Other Ambulatory Visit: Payer: Self-pay | Admitting: Adult Health

## 2022-08-07 DIAGNOSIS — R1032 Left lower quadrant pain: Secondary | ICD-10-CM

## 2022-08-12 ENCOUNTER — Other Ambulatory Visit: Payer: Self-pay

## 2022-08-12 DIAGNOSIS — M25551 Pain in right hip: Secondary | ICD-10-CM

## 2022-08-19 ENCOUNTER — Other Ambulatory Visit: Payer: Self-pay | Admitting: Adult Health

## 2022-08-19 DIAGNOSIS — R1032 Left lower quadrant pain: Secondary | ICD-10-CM

## 2022-08-19 MED ORDER — NAPROXEN 500 MG PO TABS: 500.0000 mg | ORAL_TABLET | Freq: Two times a day (BID) | ORAL | 0 refills | Status: DC

## 2022-08-27 ENCOUNTER — Other Ambulatory Visit: Payer: 59

## 2022-08-29 ENCOUNTER — Ambulatory Visit (INDEPENDENT_AMBULATORY_CARE_PROVIDER_SITE_OTHER): Payer: 59 | Admitting: Orthopaedic Surgery

## 2022-08-29 DIAGNOSIS — M1612 Unilateral primary osteoarthritis, left hip: Secondary | ICD-10-CM

## 2022-08-29 MED ORDER — CELECOXIB 200 MG PO CAPS: 200.0000 mg | ORAL_CAPSULE | Freq: Two times a day (BID) | ORAL | 3 refills | Status: DC

## 2022-08-29 NOTE — Progress Notes (Signed)
Office Visit Note   Patient: Shawn Becker           Date of Birth: Sep 07, 1970           MRN: [id number] Visit Date: 08/29/2022              Requested by: Shirline Frees, NP [address] [place],  [place] [address] PCP: Shirline Frees, NP   Assessment & Plan: Visit Diagnoses:  1. Primary osteoarthritis of left hip     Plan: I reviewed the x-rays with Shawn Becker and unfortunately the osteoarthritis is quite advanced and severe.  He has got cystic changes in the femoral head and large osteophytosis.  I explained that MRIs not necessary based on his presentation.  The injury accelerated his symptoms.  Hopefully this can be calm down with conservative treatments which we talked about at length to include physical therapy, nonimpact exercises, NSAIDs, cortisone injection.  I sent in a prescription for Celebrex and he will check with his PCP to make sure this is okay for him to take.  Will also provide him with a handout for a total hip replacement as this is something that he will realistically need in the near future.  Follow-Up Instructions: No follow-ups on file.   Orders:  No orders of the defined types were placed in this encounter.  Meds ordered this encounter  Medications   celecoxib (CELEBREX) 200 MG capsule    Sig: Take 1 capsule (200 mg total) by mouth 2 (two) times daily.    Dispense:  30 capsule    Refill:  3      Procedures: No procedures performed   Clinical Data: No additional findings.   Subjective: Chief Complaint  Patient presents with   Left Hip - Pain    HPI Shawn Becker is a very pleasant 52 year old gentleman here for evaluation of left hip and groin pain with gradual worsening over the last years.  Recently this was aggravated by an injury at home in the garage.  He has seen his primary care provider for this and an MRI was ordered due to suspicion of a labral tear but this was denied by his insurance.  He is here for to see me for  further evaluation and treatment.  He has difficulty with ADLs such as putting on socks and shoes. Review of Systems  Constitutional: Negative.   HENT: Negative.    Eyes: Negative.   Respiratory: Negative.    Cardiovascular: Negative.   Gastrointestinal: Negative.   Endocrine: Negative.   Genitourinary: Negative.   Skin: Negative.   Allergic/Immunologic: Negative.   Neurological: Negative.   Hematological: Negative.   Psychiatric/Behavioral: Negative.    All other systems reviewed and are negative.    Objective: Vital Signs: There were no vitals taken for this visit.  Physical Exam Vitals and nursing note reviewed.  Constitutional:      Appearance: He is well-developed.  HENT:     Head: Normocephalic and atraumatic.  Eyes:     Pupils: Pupils are equal, round, and reactive to light.  Pulmonary:     Effort: Pulmonary effort is normal.  Abdominal:     Palpations: Abdomen is soft.  Musculoskeletal:        General: Normal range of motion.     Cervical back: Neck supple.  Skin:    General: Skin is warm.  Neurological:     Mental Status: He is alert and oriented to person, place, and time.  Psychiatric:  Behavior: Behavior normal.        Thought Content: Thought content normal.        Judgment: Judgment normal.     Ortho Exam Examination left hip shows very limited external rotation with pain.  Positive Stinchfield.  No trochanteric tenderness.  Pain with hip flexion past 30 degrees. Specialty Comments:  No specialty comments available.  Imaging: No results found.   PMFS History: Patient Active Problem List   Diagnosis Date Noted   S/P gastric bypass 06/09/2022   Controlled type 2 diabetes mellitus without complication, without long-term current use of insulin (HCC) 05/03/2019   Class 3 severe obesity due to excess calories with serious comorbidity and body mass index (BMI) of 40.0 to 44.9 in adult [facility]) 05/02/2019   Left ventricular hypertrophy  04/14/2017   Hashimoto's thyroiditis 04/15/2016   Essential hypertension 04/15/2016   Past Medical History:  Diagnosis Date   Allergy    pollen   Arthritis    Fatty liver    Hypertension    Hypothyroidism    Left ventricular hypertrophy    Pre-diabetes     Family History  Problem Relation Age of Onset   Diabetes Mother    Hypertension Mother    Mental illness Mother    Bipolar disorder Mother    Schizophrenia Mother    Obesity Mother    Diabetes Maternal Grandmother    Hypertension Maternal Grandmother    Cancer Maternal Grandfather        colon cancer   Colon cancer Maternal Grandfather    Colon polyps Maternal Grandfather    Hyperlipidemia Paternal Grandmother    Cancer Paternal Grandfather        prostate cancer   Alcoholism Father    Drug abuse Father    Colon polyps Maternal Uncle    Esophageal cancer Neg Hx    Rectal cancer Neg Hx    Stomach cancer Neg Hx     Past Surgical History:  Procedure Laterality Date   COLONOSCOPY  2020   GASTRIC ROUX-EN-Y N/A 06/09/2022   Procedure: LAPAROSCOPIC ROUX-EN-Y GASTRIC BYPASS WITH UPPER ENDOSCOPY;  Surgeon: Gaynelle Adu, MD;  Location: [facility];  Service: General;  Laterality: N/A;   WISDOM TOOTH EXTRACTION  2018   Social History   Occupational History   Occupation: Pastor/Musician/Studeny  Tobacco Use   Smoking status: Never   Smokeless tobacco: Never  Vaping Use   Vaping Use: Never used  Substance and Sexual Activity   Alcohol use: No    Alcohol/week: 0.0 standard drinks of alcohol   Drug use: No   Sexual activity: Not on file

## 2022-09-09 ENCOUNTER — Encounter: Payer: Self-pay | Admitting: Adult Health

## 2022-09-09 ENCOUNTER — Ambulatory Visit (INDEPENDENT_AMBULATORY_CARE_PROVIDER_SITE_OTHER): Payer: 59 | Admitting: Adult Health

## 2022-09-09 VITALS — BP 120/80 | HR 82 | Temp 98.5°F | Ht 72.0 in | Wt 252.0 lb

## 2022-09-09 DIAGNOSIS — E063 Autoimmune thyroiditis: Secondary | ICD-10-CM | POA: Diagnosis not present

## 2022-09-09 DIAGNOSIS — Z125 Encounter for screening for malignant neoplasm of prostate: Secondary | ICD-10-CM

## 2022-09-09 DIAGNOSIS — I10 Essential (primary) hypertension: Secondary | ICD-10-CM | POA: Diagnosis not present

## 2022-09-09 DIAGNOSIS — Z0000 Encounter for general adult medical examination without abnormal findings: Secondary | ICD-10-CM

## 2022-09-09 DIAGNOSIS — E119 Type 2 diabetes mellitus without complications: Secondary | ICD-10-CM

## 2022-09-09 DIAGNOSIS — Z6841 Body Mass Index (BMI) 40.0 and over, adult: Secondary | ICD-10-CM

## 2022-09-09 DIAGNOSIS — E66813 Obesity, class 3: Secondary | ICD-10-CM

## 2022-09-09 LAB — LIPID PANEL
Cholesterol: 133 mg/dL (ref 0–200)
HDL: 41 mg/dL (ref >39.00)
LDL Cholesterol: 62 mg/dL (ref 0–99)
NonHDL: 92.25
Total CHOL/HDL Ratio: 3
Triglycerides: 153 mg/dL — ABNORMAL HIGH (ref 0.0–149.0)
VLDL: 30.6 mg/dL (ref 0.0–40.0)

## 2022-09-09 LAB — COMPREHENSIVE METABOLIC PANEL
ALT: 22 U/L (ref 0–53)
AST: 21 U/L (ref 0–37)
Albumin: 4.3 g/dL (ref 3.5–5.2)
Alkaline Phosphatase: 87 U/L (ref 39–117)
BUN: 8 mg/dL (ref 6–23)
CO2: 30 mEq/L (ref 19–32)
Calcium: 9.2 mg/dL (ref 8.4–10.5)
Chloride: 101 mEq/L (ref 96–112)
Creatinine, Ser: 1.16 mg/dL (ref 0.40–1.50)
GFR: 73.97 mL/min (ref >60.00)
Glucose, Bld: 96 mg/dL (ref 70–99)
Potassium: 4.1 mEq/L (ref 3.5–5.1)
Sodium: 140 mEq/L (ref 135–145)
Total Bilirubin: 0.8 mg/dL (ref 0.2–1.2)
Total Protein: 7.4 g/dL (ref 6.0–8.3)

## 2022-09-09 LAB — CBC
HCT: 43.3 % (ref 39.0–52.0)
Hemoglobin: 13.8 g/dL (ref 13.0–17.0)
MCHC: 31.9 g/dL (ref 30.0–36.0)
MCV: 82.7 fl (ref 78.0–100.0)
Platelets: 240 10*3/uL (ref 150.0–400.0)
RBC: 5.24 Mil/uL (ref 4.22–5.81)
RDW: 15.3 % (ref 11.5–15.5)
WBC: 3.6 10*3/uL — ABNORMAL LOW (ref 4.0–10.5)

## 2022-09-09 LAB — MICROALBUMIN / CREATININE URINE RATIO
Creatinine,U: 169.2 mg/dL
Microalb Creat Ratio: 0.5 mg/g (ref 0.0–30.0)
Microalb, Ur: 0.9 mg/dL (ref 0.0–1.9)

## 2022-09-09 LAB — HEMOGLOBIN A1C: Hgb A1c MFr Bld: 5.8 % (ref 4.6–6.5)

## 2022-09-09 LAB — PSA: PSA: 0.75 ng/mL (ref 0.10–4.00)

## 2022-09-09 LAB — TSH: TSH: 3.63 u[IU]/mL (ref 0.35–5.50)

## 2022-09-09 MED ORDER — PANTOPRAZOLE SODIUM 40 MG PO TBEC: 40.0000 mg | DELAYED_RELEASE_TABLET | Freq: Every day | ORAL | 1 refills | Status: DC

## 2022-09-09 NOTE — Patient Instructions (Signed)
Health Maintenance Due  Topic Date Due   OPHTHALMOLOGY EXAM  Never done   Diabetic kidney evaluation - Urine ACR  Never done   COVID-19 Vaccine (3 - 2023-24 season) 11/22/2021   FOOT EXAM  09/05/2022      Row Labels 04/02/2022    9:47 PM 02/10/2022    3:15 PM 09/04/2021    8:45 AM  Depression screen PHQ 2/9   Section Header. No data exists in this row.     Decreased Interest    0 0  Down, Depressed, Hopeless    0 0  PHQ - 2 Score    0 0  Altered sleeping     1  Tired, decreased energy     0  Change in appetite     0  Feeling bad or failure about yourself      0  Trouble concentrating     0  Moving slowly or fidgety/restless     0  Suicidal thoughts     0  PHQ-9 Score     1  Difficult doing work/chores     Not difficult at all     Information is confidential and restricted. Go to Review Flowsheets to unlock data.

## 2022-09-09 NOTE — Progress Notes (Signed)
Subjective:    Patient ID: Shawn Becker, male    DOB: 10/21/1970, 52 y.o.   MRN: [id number]  HPI Patient presents for yearly preventative medicine examination. He is a pleasant 52 year old male who  has a past medical history of Allergy, Arthritis, Fatty liver, Hypertension, Hypothyroidism, Left ventricular hypertrophy, and Pre-diabetes.  Hypertension-currently maintained on losartan 100 mg daily.  He is followed by cardiology for LVH.  He denies chest pain, shortness of breath, dizziness, lightheadedness, or syncopal episodes. BP Readings from Last 3 Encounters:  09/09/22 120/80  07/17/22 130/88  06/11/22 (!) 153/89   Hashimoto's thyroiditis-controlled on Synthroid 50 mcg daily  Gastric Bypass -he underwent laparoscopic Roux-en-Y gastric bypass on June 09, 2022.  His preoperative weight was 299 pounds with a BMI of 40.55.  The surgery he is doing well his total weight loss is roughly 47.5 pounds since surgery. He is taking Miralax for constipation and a MVI.   DM Type 2 - diagnosed in February 2022 -he was originally started on Trulicity and metformin, but metformin was stopped due to joint pain.  He did tolerate Trulicity well and did not experience any GI effects but insurance would not cover it. He is not currently on any diabetic medciation due to history of Gastric Bypass.  Lab Results  Component Value Date   HGBA1C 6.5 (H) 05/29/2022   All immunizations and health maintenance protocols were reviewed with the patient and needed orders were placed.  Appropriate screening laboratory values were ordered for the patient including screening of hyperlipidemia, renal function and hepatic function. If indicated by BPH, a PSA was ordered.  Medication reconciliation,  past medical history, social history, problem list and allergies were reviewed in detail with the patient  Goals were established with regard to weight loss, exercise, and  diet in compliance with medications. He  has not started exercising but plans to take advantage of the personal training sessions through the weight loss clinic.   Wt Readings from Last 3 Encounters:  09/09/22 252 lb (114.3 kg)  07/17/22 265 lb (120.2 kg)  06/24/22 273 lb (123.8 kg)   He is up to date on routine colon cancer screening.     Review of Systems  Constitutional: Negative.   HENT: Negative.    Eyes: Negative.   Respiratory: Negative.    Cardiovascular: Negative.   Gastrointestinal: Negative.   Endocrine: Negative.   Genitourinary: Negative.   Musculoskeletal:  Positive for arthralgias.  Skin: Negative.   Allergic/Immunologic: Negative.   Neurological: Negative.   Hematological: Negative.   Psychiatric/Behavioral: Negative.    All other systems reviewed and are negative.  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       Social Determinants of Health   Financial Resource Strain: Not on file  Food Insecurity: Not on file  Transportation Needs: No Transportation Needs (07/17/2022)   PRAPARE - Administrator, Civil Service (Medical): No    Lack of Transportation (Non-Medical): No   Physical Activity: Insufficiently Active (07/17/2022)   Exercise Vital Sign    Days of Exercise per Week: 2 days    Minutes of Exercise per Session: 30 min  Stress: Stress Concern Present (07/17/2022)   [employer] of Occupational Health - Occupational Stress Questionnaire    Feeling of Stress : To some extent  Social Connections: Socially Integrated (07/17/2022)   Social Connection and Isolation Panel [NHANES]    Frequency of Communication with Friends and Family: More than three times a week    Frequency of Social Gatherings with Friends and Family: Three times a week    Attends Religious Services: More than 4 times per year    Active Member of Clubs or Organizations: Yes    Attends Banker Meetings: More than 4 times per year    Marital Status: Married  Catering manager Violence: Not on file    Past Surgical History:  Procedure Laterality Date   COLONOSCOPY  2020   GASTRIC ROUX-EN-Y N/A 06/09/2022   Procedure: LAPAROSCOPIC ROUX-EN-Y GASTRIC BYPASS WITH UPPER ENDOSCOPY;  Surgeon: Gaynelle Adu, MD;  Location: [facility];  Service: General;  Laterality: N/A;   WISDOM TOOTH EXTRACTION  2018    Family History  Problem Relation Age of Onset   Diabetes Mother    Hypertension Mother    Mental illness Mother    Bipolar disorder Mother    Schizophrenia Mother    Obesity Mother    Diabetes Maternal Grandmother    Hypertension Maternal Grandmother    Cancer Maternal Grandfather        colon cancer   Colon cancer Maternal Grandfather    Colon polyps Maternal Grandfather    Hyperlipidemia Paternal Grandmother    Cancer Paternal Grandfather        prostate cancer   Alcoholism Father    Drug abuse Father    Colon polyps Maternal Uncle    Esophageal cancer Neg Hx    Rectal cancer Neg Hx    Stomach cancer Neg Hx     Allergies  Allergen Reactions   Metformin And Related     Joint Pain    Penicillins Hives   Shellfish Allergy Hives    Current Outpatient  Medications on File Prior to Visit  Medication Sig Dispense Refill   celecoxib (CELEBREX) 200 MG capsule Take 1 capsule (200 mg total) by mouth 2 (two) times daily. 30 capsule 3   fluticasone (FLONASE) 50 MCG/ACT nasal spray Place 1 spray into both nostrils daily as needed for allergies or rhinitis.     levothyroxine (SYNTHROID) 50 MCG tablet Take 1 tablet (50 mcg total) by mouth daily before breakfast. 90 tablet 3   losartan (COZAAR) 100 MG tablet Take 1 tablet (100 mg total) by mouth daily. 90 tablet 3   naproxen (NAPROSYN) 500 MG tablet Take 1 tablet (500 mg total) by mouth 2 (two) times daily with a meal. 30 tablet 0   ondansetron (ZOFRAN-ODT) 4 MG  disintegrating tablet Take 1 tablet (4 mg total) by mouth every 6 (six) hours as needed for nausea or vomiting. 20 tablet 0   pantoprazole (PROTONIX) 40 MG tablet Take 1 tablet (40 mg total) by mouth daily. 90 tablet 0   traMADol (ULTRAM) 50 MG tablet Take 1 tablet (50 mg total) by mouth every 6 (six) hours as needed (pain). 10 tablet 0   No current facility-administered medications on file prior to visit.    BP 120/80   Pulse 82   Temp 98.5 F (36.9 C)   Ht 6' (1.829 m)   Wt 252 lb (114.3 kg)   SpO2 98%   BMI 34.18 kg/m       Objective:   Physical Exam Vitals and nursing note reviewed.  Constitutional:      General: He is not in acute distress.    Appearance: Normal appearance. He is obese. He is not ill-appearing.  HENT:     Head: Normocephalic and atraumatic.     Right Ear: Tympanic membrane, ear canal and external ear normal. There is no impacted cerumen.     Left Ear: Tympanic membrane, ear canal and external ear normal. There is no impacted cerumen.     Nose: Nose normal. No congestion or rhinorrhea.     Mouth/Throat:     Mouth: Mucous membranes are moist.     Pharynx: Oropharynx is clear.  Eyes:     Extraocular Movements: Extraocular movements intact.     Conjunctiva/sclera: Conjunctivae normal.     Pupils: Pupils are  equal, round, and reactive to light.  Neck:     Vascular: No carotid bruit.  Cardiovascular:     Rate and Rhythm: Normal rate and regular rhythm.     Pulses: Normal pulses.     Heart sounds: No murmur heard.    No friction rub. No gallop.  Pulmonary:     Effort: Pulmonary effort is normal.     Breath sounds: Normal breath sounds.  Abdominal:     General: Abdomen is flat. Bowel sounds are normal. There is no distension.     Palpations: Abdomen is soft. There is no mass.     Tenderness: There is no abdominal tenderness. There is no guarding or rebound.     Hernia: No hernia is present.  Musculoskeletal:        General: Normal range of motion.     Cervical back: Normal range of motion and neck supple.  Lymphadenopathy:     Cervical: No cervical adenopathy.  Skin:    General: Skin is warm and dry.     Capillary Refill: Capillary refill takes less than 2 seconds.  Neurological:     General: No focal deficit present.     Mental Status: He is alert and oriented to person, place, and time.  Psychiatric:        Mood and Affect: Mood normal.        Behavior: Behavior normal.        Thought Content: Thought content normal.        Judgment: Judgment normal.        Assessment & Plan:   1. Routine general medical examination at a health care facility Today patient counseled on age appropriate routine health concerns for screening and prevention, each reviewed and up to date or declined. Immunizations reviewed and up to date or declined. Labs ordered and reviewed. Risk factors for depression reviewed and negative. Hearing function and visual acuity are intact. ADLs screened  and addressed as needed. Functional ability and level of safety reviewed and appropriate. Education, counseling and referrals performed based on assessed risks today. Patient provided with a copy of personalized plan for preventive services. - Follow up in one year or sooner if needed  2. Hashimoto's thyroiditis -  Consider dose change of synthroid  - Lipid panel; Future - TSH; Future - CBC; Future - Comprehensive metabolic panel; Future  3. Class 3 severe obesity due to excess calories with serious comorbidity and body mass index (BMI) of 40.0 to 44.9 in adult [facility]) - Encouraged diet and exercise  - Lipid panel; Future - TSH; Future - CBC; Future - Comprehensive metabolic panel; Future  4. Controlled type 2 diabetes mellitus without complication, without long-term current use of insulin (HCC) - Consider adding metformin but likely will not need to do so with weight loss  - Likely 6 month follow up - Lipid panel; Future - TSH; Future - CBC; Future - Comprehensive metabolic panel; Future - Hemoglobin A1c; Future - Microalbumin/Creatinine Ratio, Urine; Future  5. Essential hypertension - Controlled no change  - Lipid panel; Future - TSH; Future - CBC; Future - Comprehensive metabolic panel; Future  6. Prostate cancer screening  - PSA; Future  Shirline Frees, NP

## 2022-09-12 ENCOUNTER — Encounter: Payer: Self-pay | Admitting: Adult Health

## 2022-09-12 NOTE — Telephone Encounter (Signed)
Please advise 

## 2022-09-15 ENCOUNTER — Other Ambulatory Visit: Payer: Self-pay | Admitting: Adult Health

## 2022-09-15 DIAGNOSIS — E063 Autoimmune thyroiditis: Secondary | ICD-10-CM

## 2022-09-15 DIAGNOSIS — Z0000 Encounter for general adult medical examination without abnormal findings: Secondary | ICD-10-CM

## 2022-09-19 ENCOUNTER — Encounter: Payer: Self-pay | Admitting: Orthopaedic Surgery

## 2022-09-19 NOTE — Telephone Encounter (Signed)
Yes 6 months.  

## 2022-09-23 ENCOUNTER — Encounter: Payer: 59 | Attending: General Surgery | Admitting: Dietician

## 2022-09-23 ENCOUNTER — Encounter: Payer: Self-pay | Admitting: Dietician

## 2022-09-23 VITALS — Ht 72.0 in | Wt 247.7 lb

## 2022-09-23 DIAGNOSIS — E669 Obesity, unspecified: Secondary | ICD-10-CM | POA: Diagnosis present

## 2022-09-23 NOTE — Progress Notes (Signed)
Bariatric Nutrition Follow-Up Visit Medical Nutrition Therapy  Appt Start Time: 2:00   End Time: 2:51  Surgery date: 06/09/2022 Surgery type: RYGB  NUTRITION ASSESSMENT  Anthropometrics  Start weight at [facility]: 293.4 lbs (date: 02/10/2022)  Height: 72 in Weight today: 247.7 lbs BMI:  kg/m2     Clinical  Medical hx: hypertension, diabetes mellitus type 2, osteoarthritis of multiple joints, Hashimoto's thyroiditis  Medications: Losartan HCT, levothyroxine, vit D, Celebrex (occasionally)  Labs: A1C 6.3; TSH 5.180 Notable signs/symptoms: none noted Any previous deficiencies? No Bowel Habits: Every day to every other day no complaints   Body Composition Scale 06/24/2022 09/23/2022  Current Body Weight 273.0 247.7  Total Body Fat % 32.4 29.0  Visceral Fat 24   Fat-Free Mass % 67.5    Total Body Water % 48.5 51.9  Muscle-Mass lbs 51.4   BMI 37.1 33.6  Body Fat Displacement           Torso  lbs 54.9          Left Leg  lbs 10.9          Right Leg  lbs 10.9          Left Arm  lbs 5.4          Right Arm  lbs 5.4      Lifestyle & Dietary Hx  Pt states his eating has been a little off, stating that the foods that he shouldn't eat was more tolerable. Pt states since he wasn't eating much, he didn't have any weight change.  Pt states he was getting sick taking the calcium, stating he stopped taking the calcium. Ca labs WNL as of two weeks ago. Pt states he is starting BELT this week.  Pt states he would like to lose about 25 more pounds and build some muscle. Pt states at the movies he had a slushie, stating he loves the blue icee. Pt states he eats whatever he wants. Pt agreeable to get back to the basics to reach his goal and see results.  Estimated daily fluid intake: 64 oz Estimated daily protein intake: not tracking protein Supplements: Multivitamin Current average weekly physical activity: 4 days a week at the gym, light weights high rep, 15 minutes on the bike.   24-Hr  Dietary Recall First Meal: cracker barrel (sausage and french toast wit regular syrup) Snack:  Second Meal: burger from [employer]: watermelon, cantaloupe   Third Meal: chicken and green beans (at home) Snack: cheesecake Beverages: water, diet lemonade from [employer], wife's sweet tea  Post-Op Goals/ Signs/ Symptoms Using straws: sometimes Drinking while eating: no Chewing/swallowing difficulties: no Changes in vision: no Changes to mood/headaches: no Hair loss/changes to skin/nails: no Difficulty focusing/concentrating: no Sweating: no Limb weakness: no Dizziness/lightheadedness: no Palpitations: no  Carbonated/caffeinated beverages: no N/V/D/C/Gas: no Abdominal pain: no Dumping syndrome: no    NUTRITION DIAGNOSIS  Overweight/obesity ([place]-3.3) related to past poor dietary habits and physical inactivity as evidenced by completed bariatric surgery and following dietary guidelines for continued weight loss and healthy nutrition status.     NUTRITION INTERVENTION Nutrition counseling (C-1) and education (E-2) to facilitate bariatric surgery goals, including: Diet advancement to the standard prep plan. The importance of consuming adequate calories as well as certain nutrients daily due to the body's need for essential vitamins, minerals, and fats The importance of daily physical activity and to reach a goal of at least 150 minutes of moderate to vigorous physical activity weekly (or as directed by their  physician) due to benefits such as increased musculature and improved lab values The importance of intuitive eating specifically learning hunger-satiety cues and understanding the importance of learning a new body: The importance of mindful eating to avoid grazing behaviors  Encouraged patient to honor their body's internal hunger and fullness cues.  Throughout the day, check in mentally and rate hunger. Stop eating when satisfied not full regardless of how much food is left on  the plate.  Get more if still hungry 20-30 minutes later.  The key is to honor satisfaction so throughout the meal, rate fullness factor and stop when comfortably satisfied not physically full. The key is to honor hunger and fullness without any feelings of guilt or shame.  Pay attention to what the internal cues are, rather than any external factors. This will enhance the confidence you have in listening to your own body and following those internal cues enabling you to increase how often you eat when you are hungry not out of appetite and stop when you are satisfied not full.  Encouraged pt to continue to eat balanced meals inclusive of non starchy vegetables 2 times a day 7 days a week Encouraged pt to choose lean protein sources: limiting beef, pork, sausage, hotdogs, and lunch meat Encourage pt to choose healthy fats such as plant based limiting animal fats Encouraged pt to continue to drink a minium 64 fluid ounces with half being plain water to satisfy proper hydration    Goals Set meal and snack times; avoid grazing; sipping fluids in between Start BELT program Aim for 2 or more servings of non-starchy vegetable per day.  Handouts Provided Include  Standard Prep Plan Advancement Guide  Learning Style & Readiness for Change Teaching method utilized: Visual & Auditory  Demonstrated degree of understanding via: Teach Back  Readiness Level: contemplative Barriers to learning/adherence to lifestyle change: has gone back to old habits  RD's Notes for Next Visit Assess adherence to pt chosen goals  MONITORING & EVALUATION Dietary intake, weekly physical activity, body weight.  Next Steps Patient is to return in 3 months for 6 month post-op follow-up.

## 2022-10-11 ENCOUNTER — Encounter: Payer: Self-pay | Admitting: Adult Health

## 2022-10-14 NOTE — Telephone Encounter (Signed)
Please advise 

## 2022-11-15 ENCOUNTER — Other Ambulatory Visit: Payer: Self-pay | Admitting: Adult Health

## 2022-11-15 DIAGNOSIS — E063 Autoimmune thyroiditis: Secondary | ICD-10-CM

## 2022-11-15 DIAGNOSIS — Z0000 Encounter for general adult medical examination without abnormal findings: Secondary | ICD-10-CM

## 2022-11-27 ENCOUNTER — Ambulatory Visit: Payer: 59 | Admitting: Dietician

## 2022-12-12 ENCOUNTER — Other Ambulatory Visit: Payer: Self-pay | Admitting: Adult Health

## 2023-02-06 ENCOUNTER — Other Ambulatory Visit: Payer: Self-pay | Admitting: Adult Health

## 2023-02-06 DIAGNOSIS — Z0000 Encounter for general adult medical examination without abnormal findings: Secondary | ICD-10-CM

## 2023-02-06 DIAGNOSIS — E063 Autoimmune thyroiditis: Secondary | ICD-10-CM

## 2023-03-10 ENCOUNTER — Encounter: Payer: Self-pay | Admitting: Orthopaedic Surgery

## 2023-03-10 NOTE — Telephone Encounter (Signed)
Sure 5 years

## 2023-03-10 NOTE — Telephone Encounter (Signed)
6 mos

## 2023-03-11 ENCOUNTER — Other Ambulatory Visit: Payer: Self-pay | Admitting: Adult Health

## 2023-05-06 ENCOUNTER — Other Ambulatory Visit: Payer: Self-pay | Admitting: Adult Health

## 2023-05-06 DIAGNOSIS — Z0000 Encounter for general adult medical examination without abnormal findings: Secondary | ICD-10-CM

## 2023-05-06 DIAGNOSIS — E063 Autoimmune thyroiditis: Secondary | ICD-10-CM

## 2023-06-09 ENCOUNTER — Encounter: Attending: Student | Admitting: Dietician

## 2023-06-09 ENCOUNTER — Encounter: Payer: Self-pay | Admitting: Dietician

## 2023-06-09 VITALS — Ht 72.0 in | Wt 240.5 lb

## 2023-06-09 DIAGNOSIS — E669 Obesity, unspecified: Secondary | ICD-10-CM | POA: Diagnosis present

## 2023-06-09 DIAGNOSIS — E119 Type 2 diabetes mellitus without complications: Secondary | ICD-10-CM | POA: Diagnosis present

## 2023-06-09 NOTE — Progress Notes (Signed)
 Bariatric Nutrition Follow-Up Visit Medical Nutrition Therapy  Appt Start Time: 0901   End Time: 0944  Surgery date: 06/09/2022 Surgery type: RYGB  NUTRITION ASSESSMENT  Anthropometrics  Start weight at [facility]: 293.4 lbs (date: 02/10/2022)  Height: 72 in Weight today: 240.5 lbs   Clinical  Medical hx: hypertension, diabetes mellitus type 2, osteoarthritis of multiple joints, Hashimoto's thyroiditis  Medications: Losartan HCT, levothyroxine, vit D, Celebrex (occasionally)  Labs: A1C 6.3; TSH 5.180 Notable signs/symptoms: none noted Any previous deficiencies? No Bowel Habits: Every day to every other day no complaints   Body Composition Scale 06/24/2022 09/23/2022 06/09/2023  Current Body Weight 273.0 247.7 240.5  Total Body Fat % 32.4 29.0 28.0  Visceral Fat 24  18  Fat-Free Mass % 67.5  71.9   Total Body Water % 48.5 51.9 52.9  Muscle-Mass lbs 51.4  45.2  BMI 37.1 33.6 32.6  Body Fat Displacement            Torso  lbs 54.9  41.7         Left Leg  lbs 10.9  8.3         Right Leg  lbs 10.9  8.3         Left Arm  lbs 5.4  4.1         Right Arm  lbs 5.4  4.1     Lifestyle & Dietary Hx  Pt states when he is in school he gets busy, stating he is in school in [place] and works in [place]. Pt states she has dropped off physical activity and eating patterns and meal prepping. Pt states he eats whatever he wants, stating he can eat more volume of the foods that don't meet the goals of bariatric eating patterns.  Estimated daily fluid intake: 64 oz Estimated daily protein intake: not tracking protein Supplements: Multivitamin (not taking calcium; PCP told him not to) Current average weekly physical activity: ADLs (states he has been out of the gym since late October).  24-Hr Dietary Recall First Meal: skinny biscuit form [employer] or wheat peanut butter crackers and coffee Snack: mini glazed donuts  Second Meal: fish sandwich from [employer]: baked chicken and  collard greens Third Meal: dinner out at [employer] had some nachos Snack: laffy taffy candy Beverages: water, wife's sweet tea, soda  Post-Op Goals/ Signs/ Symptoms Using straws: sometimes Drinking while eating: no Chewing/swallowing difficulties: no Changes in vision: no Changes to mood/headaches: no Hair loss/changes to skin/nails: no Difficulty focusing/concentrating: no Sweating: no Limb weakness: no Dizziness/lightheadedness: no Palpitations: no  Carbonated/caffeinated beverages: no N/V/D/C/Gas: no Abdominal pain: no Dumping syndrome: no   NUTRITION DIAGNOSIS  Overweight/obesity ([place]-3.3) related to past poor dietary habits and physical inactivity as evidenced by completed bariatric surgery and following dietary guidelines for continued weight loss and healthy nutrition status.   NUTRITION INTERVENTION Nutrition counseling (C-1) and education (E-2) to facilitate bariatric surgery goals, including: The importance of consuming adequate calories as well as certain nutrients daily due to the body's need for essential vitamins, minerals, and fats The importance of daily physical activity and to reach a goal of at least 150 minutes of moderate to vigorous physical activity weekly (or as directed by their physician) due to benefits such as increased musculature and improved lab values The importance of intuitive eating specifically learning hunger-satiety cues and understanding the importance of learning a new body: The importance of mindful eating to avoid grazing behaviors  Encouraged patient to honor their body's internal hunger and fullness  cues.  Throughout the day, check in mentally and rate hunger. Stop eating when satisfied not full regardless of how much food is left on the plate.  Get more if still hungry 20-30 minutes later.  The key is to honor satisfaction so throughout the meal, rate fullness factor and stop when comfortably satisfied not physically full. The key is to honor  hunger and fullness without any feelings of guilt or shame.  Pay attention to what the internal cues are, rather than any external factors. This will enhance the confidence you have in listening to your own body and following those internal cues enabling you to increase how often you eat when you are hungry not out of appetite and stop when you are satisfied not full.  Encouraged pt to continue to eat balanced meals inclusive of non starchy vegetables 2 times a day 7 days a week Encouraged pt to continue to drink a minium 64 fluid ounces with half being plain water to satisfy proper hydration. Sugar-sweetened beverages (SSBs) can cause a rapid spike in blood sugar levels due to their high sugar content, which leads to a surge in insulin production and can contribute to weight gain, insulin resistance, and an increased risk of type 2 diabetes and heart disease.  Emphasizing physical activity after bariatric surgery is crucial for maximizing weight loss, maintaining long-term weight management, and improving overall health outcomes, including cardiovascular health, muscle strength, and bone density.  Resistance exercise after bariatric surgery is crucial for maintaining and building muscle mass, preventing bone loss, improving metabolism, and aiding in long-term weight management and overall health.   Encouraged pt to include protein in every meal and snack is crucial for supporting healing, maintaining muscle mass, and promoting overall health, as the body needs protein to rebuild tissues and fight infections.   Goals Continue: Set meal and snack times; avoid grazing; sipping fluids in between Continue: Aim for 2 or more servings of non-starchy vegetable per day; start the tower garden at home New: increase physical activity; walking at school and from church to the [employer]; light weights to reduce muscle loss New: cut out sugar sweetened beverages; use [place] bottle; water bottle while in class.  Handouts  Provided Include  Maintenance Plan (1 year) Fast Food Meal Ideas post bariatric surgery; On the Go Meal Options  Learning Style & Readiness for Change Teaching method utilized: Visual & Auditory  Demonstrated degree of understanding via: Teach Back  Readiness Level: contemplative Barriers to learning/adherence to lifestyle change: has gone back to old habits  RD's Notes for Next Visit Assess adherence to pt chosen goals  MONITORING & EVALUATION Dietary intake, weekly physical activity, body weight.  Next Steps Patient is to return in July for follow-up.

## 2023-06-15 ENCOUNTER — Other Ambulatory Visit: Payer: Self-pay | Admitting: Adult Health

## 2023-07-24 ENCOUNTER — Telehealth: Payer: Self-pay

## 2023-07-24 NOTE — Telephone Encounter (Signed)
 Copied from CRM [id number]. Topic: General - Other >> Jul 24, 2023  2:35 PM Dorisann Garre T wrote: Reason for CRM: patient wife called in to get patient scheduled sooner for a appt to see the dr and get labs done to check kidney function

## 2023-07-24 NOTE — Telephone Encounter (Signed)
 Pt stated that when he went to get labs at the biariactric clinic his creatinine came back elevated. Creatinine was at 1.28 pt was worried about kidney damage. I advised that this number was in range and went over GFR with pt which was at 68. I also advised pt that it was within range and inform him that he is due for his CPE and labs will be checked then. PT stated that he will call back in to schedule CPE.

## 2023-07-27 ENCOUNTER — Other Ambulatory Visit

## 2023-08-04 ENCOUNTER — Other Ambulatory Visit: Payer: Self-pay | Admitting: Adult Health

## 2023-08-04 DIAGNOSIS — Z0000 Encounter for general adult medical examination without abnormal findings: Secondary | ICD-10-CM

## 2023-08-04 DIAGNOSIS — E063 Autoimmune thyroiditis: Secondary | ICD-10-CM

## 2023-08-06 ENCOUNTER — Telehealth: Payer: Self-pay | Admitting: Adult Health

## 2023-08-06 NOTE — Telephone Encounter (Signed)
 Copied from CRM [id number]. 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 Please call him when it's ready to be picked up at [phone].

## 2023-08-06 NOTE — Telephone Encounter (Signed)
 Copies placed in front office filing cabinet. Pt aware.

## 2023-08-07 ENCOUNTER — Telehealth: Payer: Self-pay | Admitting: Adult Health

## 2023-08-07 NOTE — Telephone Encounter (Signed)
 Copied from CRM [id number]. Topic: Appointments - Scheduling Inquiry for Clinic >> Aug 06, 2023  2:34 PM Jenice Mitts wrote: Reason for CRM: Patient is calling to schedule a TB test  Can be reached at [phone]

## 2023-08-07 NOTE — Telephone Encounter (Signed)
 Pt would like quantiferon tb blood terst

## 2023-08-12 NOTE — Telephone Encounter (Signed)
 Called pt and he stated that he had the lab done already. No further action needed.

## 2023-09-10 ENCOUNTER — Encounter: Payer: Self-pay | Admitting: Adult Health

## 2023-09-10 ENCOUNTER — Ambulatory Visit: Payer: Self-pay | Admitting: Adult Health

## 2023-09-10 ENCOUNTER — Other Ambulatory Visit: Payer: Self-pay | Admitting: Adult Health

## 2023-09-10 ENCOUNTER — Ambulatory Visit (INDEPENDENT_AMBULATORY_CARE_PROVIDER_SITE_OTHER): Admitting: Adult Health

## 2023-09-10 VITALS — BP 138/84 | HR 72 | Temp 98.2°F | Wt 245.0 lb

## 2023-09-10 DIAGNOSIS — E063 Autoimmune thyroiditis: Secondary | ICD-10-CM

## 2023-09-10 DIAGNOSIS — Z125 Encounter for screening for malignant neoplasm of prostate: Secondary | ICD-10-CM

## 2023-09-10 DIAGNOSIS — Z0000 Encounter for general adult medical examination without abnormal findings: Secondary | ICD-10-CM

## 2023-09-10 DIAGNOSIS — I10 Essential (primary) hypertension: Secondary | ICD-10-CM | POA: Diagnosis not present

## 2023-09-10 DIAGNOSIS — D72819 Decreased white blood cell count, unspecified: Secondary | ICD-10-CM

## 2023-09-10 DIAGNOSIS — E119 Type 2 diabetes mellitus without complications: Secondary | ICD-10-CM

## 2023-09-10 DIAGNOSIS — Z9884 Bariatric surgery status: Secondary | ICD-10-CM

## 2023-09-10 DIAGNOSIS — E66811 Obesity, class 1: Secondary | ICD-10-CM

## 2023-09-10 LAB — COMPREHENSIVE METABOLIC PANEL WITH GFR
ALT: 21 U/L (ref 0–53)
AST: 31 U/L (ref 0–37)
Albumin: 4.3 g/dL (ref 3.5–5.2)
Alkaline Phosphatase: 65 U/L (ref 39–117)
BUN: 11 mg/dL (ref 6–23)
CO2: 28 meq/L (ref 19–32)
Calcium: 9 mg/dL (ref 8.4–10.5)
Chloride: 104 meq/L (ref 96–112)
Creatinine, Ser: 1.11 mg/dL (ref 0.40–1.50)
GFR: 77.44 mL/min (ref >60.00)
Glucose, Bld: 90 mg/dL (ref 70–99)
Potassium: 3.9 meq/L (ref 3.5–5.1)
Sodium: 138 meq/L (ref 135–145)
Total Bilirubin: 0.9 mg/dL (ref 0.2–1.2)
Total Protein: 7.1 g/dL (ref 6.0–8.3)

## 2023-09-10 LAB — CBC
HCT: 43.5 % (ref 39.0–52.0)
Hemoglobin: 14.4 g/dL (ref 13.0–17.0)
MCHC: 33 g/dL (ref 30.0–36.0)
MCV: 82.5 fl (ref 78.0–100.0)
Platelets: 211 10*3/uL (ref 150.0–400.0)
RBC: 5.27 Mil/uL (ref 4.22–5.81)
RDW: 13.4 % (ref 11.5–15.5)
WBC: 2.7 10*3/uL — ABNORMAL LOW (ref 4.0–10.5)

## 2023-09-10 LAB — LIPID PANEL
Cholesterol: 148 mg/dL (ref 0–200)
HDL: 47.8 mg/dL (ref >39.00)
LDL Cholesterol: 82 mg/dL (ref 0–99)
NonHDL: 99.79
Total CHOL/HDL Ratio: 3
Triglycerides: 90 mg/dL (ref 0.0–149.0)
VLDL: 18 mg/dL (ref 0.0–40.0)

## 2023-09-10 LAB — HEMOGLOBIN A1C: Hgb A1c MFr Bld: 5.9 % (ref 4.6–6.5)

## 2023-09-10 LAB — MICROALBUMIN / CREATININE URINE RATIO
Creatinine,U: 184.3 mg/dL
Microalb Creat Ratio: 5.6 mg/g (ref 0.0–30.0)
Microalb, Ur: 1 mg/dL (ref 0.0–1.9)

## 2023-09-10 LAB — TSH: TSH: 4.17 u[IU]/mL (ref 0.35–5.50)

## 2023-09-10 LAB — PSA: PSA: 0.4 ng/mL (ref 0.10–4.00)

## 2023-09-10 MED ORDER — LOSARTAN POTASSIUM 100 MG PO TABS: 100.0000 mg | ORAL_TABLET | Freq: Every day | ORAL | 3 refills | Status: AC

## 2023-09-10 NOTE — Progress Notes (Signed)
 Subjective:    Patient ID: SENDER RUEB, male    DOB: March 04, 1971, 53 y.o.   MRN: [id number]  HPI Patient presents for yearly preventative medicine examination. He is a 53 year old male who  has a past medical history of Allergy, Arthritis, Fatty liver, Hypertension, Hypothyroidism, Left ventricular hypertrophy, and Pre-diabetes.  Hypertension-currently maintained on losartan  100 mg daily.  He is followed by cardiology for LVH.  He denies chest pain, shortness of breath, dizziness, lightheadedness, or syncopal episodes. 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  His preoperative weight was 299 pounds with a BMI of 40.55.  The surgery he is doing well his total weight loss is roughly 55 pounds since surgery. He is taking an MVI. He was seen by Gastric Surgery earlier this year for his annual appointment.  He has started working out again since he is on summer break from school.  Wt Readings from Last 3 Encounters:  09/10/23 245 lb (111.1 kg)  06/09/23 240 lb 8 oz (109.1 kg)  09/23/22 247 lb 11.2 oz (112.4 kg)   DM Type 2 - diagnosed in February 2022 -he was originally started on Trulicity  and metformin , but metformin  was stopped due to joint pain.  He did tolerate Trulicity  well and did not experience any GI effects but insurance would not cover it. He is not currently on any diabetic medciation due to history of Gastric Bypass.  Lab Results  Component Value Date   HGBA1C 5.8 09/09/2022   HGBA1C 6.5 (H) 05/29/2022   HGBA1C 6.1 09/04/2021    All immunizations and health maintenance protocols were reviewed with the patient and needed orders were placed.  Appropriate screening laboratory values were ordered for the patient including screening of hyperlipidemia, renal function and  hepatic function.   Medication reconciliation,  past medical history, social history, problem list and allergies were reviewed in detail with the patient  Goals were established with regard to weight loss, exercise, and  diet in compliance with medications  He is up to date on routine colon cancer screening   Review of Systems  Constitutional: Negative.   HENT: Negative.    Eyes: Negative.   Respiratory: Negative.    Cardiovascular: Negative.   Gastrointestinal: Negative.   Endocrine: Negative.   Genitourinary: Negative.   Musculoskeletal: Negative.   Skin: Negative.   Allergic/Immunologic: Negative.   Neurological: Negative.   Hematological: Negative.   Psychiatric/Behavioral: Negative.    All other systems reviewed and are negative.  Past Medical History:  Diagnosis Date   Allergy    pollen   Arthritis    Fatty liver    Hypertension    Hypothyroidism    Left ventricular hypertrophy    Pre-diabetes     Social History   Socioeconomic History   Marital status: Married    Spouse name: Taneka   Number of children: Not on file   Years of education: Not on file   Highest education level: Bachelor's degree (e.g., BA, AB, BS)  Occupational History   Occupation: Pastor/Musician/Studeny  Tobacco Use   Smoking status: Never   Smokeless tobacco: Never  Vaping Use   Vaping status: Never Used  Substance and Sexual Activity   Alcohol use: No    Alcohol/week: 0.0 standard drinks of alcohol   Drug use: No   Sexual  activity: Not on file  Other Topics Concern   Not on file  Social History Narrative   Geralynn Knife    Married    4 children          He likes to ride his motorcycle.       Social Drivers of Corporate investment banker Strain: Not on file  Food Insecurity: Not on file  Transportation Needs: No Transportation Needs (07/17/2022)   PRAPARE - Administrator, Civil Service (Medical): No    Lack of Transportation (Non-Medical): No  Physical  Activity: Insufficiently Active (07/17/2022)   Exercise Vital Sign    Days of Exercise per Week: 2 days    Minutes of Exercise per Session: 30 min  Stress: Stress Concern Present (07/17/2022)   [employer] of Occupational Health - Occupational Stress Questionnaire    Feeling of Stress : To some extent  Social Connections: Socially Integrated (07/17/2022)   Social Connection and Isolation Panel    Frequency of Communication with Friends and Family: More than three times a week    Frequency of Social Gatherings with Friends and Family: Three times a week    Attends Religious Services: More than 4 times per year    Active Member of Clubs or Organizations: Yes    Attends Banker Meetings: More than 4 times per year    Marital Status: Married  Catering manager Violence: Unknown (06/28/2021)   Received from [facility]   HITS    Physically Hurt: Not on file    Insult or Talk Down To: Not on file    Threaten Physical Harm: Not on file    Scream or Curse: Not on file    Past Surgical History:  Procedure Laterality Date   COLONOSCOPY  2020   GASTRIC ROUX-EN-Y N/A 06/09/2022   Procedure: LAPAROSCOPIC ROUX-EN-Y GASTRIC BYPASS WITH UPPER ENDOSCOPY;  Surgeon: Aldean Hummingbird, MD;  Location: [facility];  Service: General;  Laterality: N/A;   WISDOM TOOTH EXTRACTION  2018    Family History  Problem Relation Age of Onset   Diabetes Mother    Hypertension Mother    Mental illness Mother    Bipolar disorder Mother    Schizophrenia Mother    Obesity Mother    Diabetes Maternal Grandmother    Hypertension Maternal Grandmother    Cancer Maternal Grandfather        colon cancer   Colon cancer Maternal Grandfather    Colon polyps Maternal Grandfather    Hyperlipidemia Paternal Grandmother    Cancer Paternal Grandfather        prostate cancer   Alcoholism Father    Drug abuse Father    Colon polyps Maternal Uncle    Esophageal cancer Neg Hx    Rectal cancer Neg Hx    Stomach  cancer Neg Hx     Allergies  Allergen Reactions   Metformin  And Related     Joint Pain    Penicillins Hives   Shellfish Allergy Hives    Current Outpatient Medications on File Prior to Visit  Medication Sig Dispense Refill   celecoxib  (CELEBREX ) 200 MG capsule Take 1 capsule (200 mg total) by mouth 2 (two) times daily. 30 capsule 3   fluticasone  (FLONASE ) 50 MCG/ACT nasal spray Place 1 spray into both nostrils daily as needed for allergies or rhinitis.     levothyroxine  (SYNTHROID ) 50 MCG tablet TAKE 1 TABLET BY MOUTH ONCE DAILY BEFORE BREAKFAST 90 tablet 0   losartan  (  COZAAR ) 100 MG tablet Take 1 tablet by mouth once daily 90 tablet 0   naproxen  (NAPROSYN ) 500 MG tablet Take 1 tablet (500 mg total) by mouth 2 (two) times daily with a meal. 30 tablet 0   ondansetron  (ZOFRAN -ODT) 4 MG disintegrating tablet Take 1 tablet (4 mg total) by mouth every 6 (six) hours as needed for nausea or vomiting. 20 tablet 0   pantoprazole  (PROTONIX ) 40 MG tablet Take 1 tablet (40 mg total) by mouth daily. 90 tablet 1   traMADol  (ULTRAM ) 50 MG tablet Take 1 tablet (50 mg total) by mouth every 6 (six) hours as needed (pain). 10 tablet 0   No current facility-administered medications on file prior to visit.    BP 138/84   Pulse 72   Temp 98.2 F (36.8 C) (Oral)   Wt 245 lb (111.1 kg)   SpO2 98%   BMI 33.23 kg/m       Objective:   Physical Exam Vitals and nursing note reviewed.  Constitutional:      General: He is not in acute distress.    Appearance: Normal appearance. He is obese. He is not ill-appearing.  HENT:     Head: Normocephalic and atraumatic.     Right Ear: Tympanic membrane, ear canal and external ear normal. There is no impacted cerumen.     Left Ear: Tympanic membrane, ear canal and external ear normal. There is no impacted cerumen.     Nose: Nose normal. No congestion or rhinorrhea.     Mouth/Throat:     Mouth: Mucous membranes are moist.     Pharynx: Oropharynx is clear.    Eyes:     Extraocular Movements: Extraocular movements intact.     Conjunctiva/sclera: Conjunctivae normal.     Pupils: Pupils are equal, round, and reactive to light.   Neck:     Vascular: No carotid bruit.   Cardiovascular:     Rate and Rhythm: Normal rate and regular rhythm.     Pulses: Normal pulses.     Heart sounds: No murmur heard.    No friction rub. No gallop.  Pulmonary:     Effort: Pulmonary effort is normal.     Breath sounds: Normal breath sounds.  Abdominal:     General: Abdomen is flat. Bowel sounds are normal. There is no distension.     Palpations: Abdomen is soft. There is no mass.     Tenderness: There is no abdominal tenderness. There is no guarding or rebound.     Hernia: No hernia is present.   Musculoskeletal:        General: Normal range of motion.     Cervical back: Normal range of motion and neck supple.  Lymphadenopathy:     Cervical: No cervical adenopathy.   Skin:    General: Skin is warm and dry.     Capillary Refill: Capillary refill takes less than 2 seconds.   Neurological:     General: No focal deficit present.     Mental Status: He is alert and oriented to person, place, and time.   Psychiatric:        Mood and Affect: Mood normal.        Behavior: Behavior normal.        Thought Content: Thought content normal.        Judgment: Judgment normal.        Assessment & Plan:  1. Routine general medical examination at a health care facility (Primary) Today patient  counseled on age appropriate routine health concerns for screening and prevention, each reviewed and up to date or declined. Immunizations reviewed and up to date or declined. Labs ordered and reviewed. Risk factors for depression reviewed and negative. Hearing function and visual acuity are intact. ADLs screened and addressed as needed. Functional ability and level of safety reviewed and appropriate. Education, counseling and referrals performed based on assessed risks today.  Patient provided with a copy of personalized plan for preventive services. - Follow up in one year or sooner if needed  2. Essential hypertension - Well controlled. No change in medication  - Lipid panel; Future - TSH; Future - CBC; Future - Comprehensive metabolic panel with GFR; Future  3. Hashimoto's thyroiditis - Consider dose change of synthroid   - Lipid panel; Future - TSH; Future - CBC; Future - Comprehensive metabolic panel with GFR; Future - losartan  (COZAAR ) 100 MG tablet; Take 1 tablet (100 mg total) by mouth daily.  Dispense: 90 tablet; Refill: 3  4. History of Roux-en-Y gastric bypass - Continue to exercise, eat healthy and stay hydrated.  - Follow up with Gastric Surgery as directed  - Lipid panel; Future - TSH; Future - CBC; Future - Comprehensive metabolic panel with GFR; Future  5. Diet-controlled diabetes mellitus (HCC) - Consider Metformin   - Likely follow up in 6 months  - Lipid panel; Future - TSH; Future - CBC; Future - Comprehensive metabolic panel with GFR; Future - Hemoglobin A1c; Future - Microalbumin / creatinine urine ratio; Future  6. Class 1 obesity - Continue with lifestyle modifications  - Lipid panel; Future - TSH; Future - CBC; Future - Comprehensive metabolic panel with GFR; Future  7. Prostate cancer screening  - PSA; Future  Alto Atta, NP

## 2023-09-11 ENCOUNTER — Ambulatory Visit: Payer: Self-pay | Admitting: Adult Health

## 2023-09-11 NOTE — Telephone Encounter (Signed)
 FYI Only or Action Required?: Action required by provider: Results.  Patient was last seen in primary care on 09/10/2023 by Alto Atta, NP. Called Nurse Triage reporting Results.   Triage Disposition: Call PCP When Office is Open  Patient/caregiver understands and will follow disposition?: Yes                                          Reason for Disposition  [1] Caller requesting NON-URGENT health information AND [2] PCP's office is the best resource  Answer Assessment - Initial Assessment Questions 1. REASON FOR CALL or QUESTION: What is your reason for calling today? or How can I best help you? or What question do you have that I can help answer?     Patient called stating he missed a call from [facility] about his results. This RN does not see any results in chart. Call back number for patient [phone]  Protocols used: Information Only Call - No Triage-A-AH

## 2023-09-22 ENCOUNTER — Ambulatory Visit: Admitting: Dietician

## 2023-10-13 ENCOUNTER — Other Ambulatory Visit

## 2023-10-15 ENCOUNTER — Other Ambulatory Visit (INDEPENDENT_AMBULATORY_CARE_PROVIDER_SITE_OTHER)

## 2023-10-15 ENCOUNTER — Ambulatory Visit: Payer: Self-pay | Admitting: Adult Health

## 2023-10-15 DIAGNOSIS — D72819 Decreased white blood cell count, unspecified: Secondary | ICD-10-CM | POA: Diagnosis not present

## 2023-10-15 LAB — CBC
HCT: 43 % (ref 39.0–52.0)
Hemoglobin: 14.1 g/dL (ref 13.0–17.0)
MCHC: 32.7 g/dL (ref 30.0–36.0)
MCV: 83.2 fl (ref 78.0–100.0)
Platelets: 196 K/uL (ref 150.0–400.0)
RBC: 5.17 Mil/uL (ref 4.22–5.81)
RDW: 13.7 % (ref 11.5–15.5)
WBC: 3.1 K/uL — ABNORMAL LOW (ref 4.0–10.5)

## 2023-11-02 ENCOUNTER — Other Ambulatory Visit: Payer: Self-pay | Admitting: Adult Health

## 2023-11-02 DIAGNOSIS — Z0000 Encounter for general adult medical examination without abnormal findings: Secondary | ICD-10-CM

## 2023-11-02 DIAGNOSIS — E063 Autoimmune thyroiditis: Secondary | ICD-10-CM

## 2023-11-10 ENCOUNTER — Encounter: Payer: Self-pay | Admitting: Adult Health

## 2023-11-10 DIAGNOSIS — E063 Autoimmune thyroiditis: Secondary | ICD-10-CM

## 2023-11-10 DIAGNOSIS — Z0000 Encounter for general adult medical examination without abnormal findings: Secondary | ICD-10-CM

## 2023-11-11 MED ORDER — LEVOTHYROXINE SODIUM 50 MCG PO TABS: 50.0000 ug | ORAL_TABLET | Freq: Every day | ORAL | 0 refills | Status: DC

## 2024-01-23 ENCOUNTER — Other Ambulatory Visit: Payer: Self-pay | Admitting: Adult Health

## 2024-01-23 DIAGNOSIS — E063 Autoimmune thyroiditis: Secondary | ICD-10-CM

## 2024-01-23 DIAGNOSIS — Z0000 Encounter for general adult medical examination without abnormal findings: Secondary | ICD-10-CM

## 2024-01-25 ENCOUNTER — Encounter: Payer: Self-pay | Admitting: Radiology

## 2024-04-29 ENCOUNTER — Other Ambulatory Visit: Payer: Self-pay | Admitting: Adult Health

## 2024-04-29 DIAGNOSIS — Z0000 Encounter for general adult medical examination without abnormal findings: Secondary | ICD-10-CM

## 2024-04-29 DIAGNOSIS — E063 Autoimmune thyroiditis: Secondary | ICD-10-CM
